# Patient Record
Sex: Female | Born: 1970 | Race: White | Hispanic: No | Marital: Married | Smoking: Never smoker
Health system: Southern US, Community
[De-identification: ages and names within clinical notes are randomized; demographics above are authoritative.]

## PROBLEM LIST (undated history)

## (undated) DIAGNOSIS — E039 Hypothyroidism, unspecified: Secondary | ICD-10-CM

## (undated) DIAGNOSIS — R413 Other amnesia: Secondary | ICD-10-CM

## (undated) DIAGNOSIS — N809 Endometriosis, unspecified: Secondary | ICD-10-CM

## (undated) DIAGNOSIS — A609 Anogenital herpesviral infection, unspecified: Secondary | ICD-10-CM

## (undated) DIAGNOSIS — I959 Hypotension, unspecified: Secondary | ICD-10-CM

## (undated) DIAGNOSIS — IMO0002 Reserved for concepts with insufficient information to code with codable children: Secondary | ICD-10-CM

## (undated) DIAGNOSIS — C4491 Basal cell carcinoma of skin, unspecified: Secondary | ICD-10-CM

## (undated) DIAGNOSIS — R569 Unspecified convulsions: Secondary | ICD-10-CM

## (undated) DIAGNOSIS — R51 Headache: Secondary | ICD-10-CM

## (undated) DIAGNOSIS — S0990XA Unspecified injury of head, initial encounter: Secondary | ICD-10-CM

## (undated) DIAGNOSIS — M1990 Unspecified osteoarthritis, unspecified site: Secondary | ICD-10-CM

## (undated) HISTORY — DX: Endometriosis, unspecified: N80.9

## (undated) HISTORY — DX: Unspecified injury of head, initial encounter: S09.90XA

## (undated) HISTORY — DX: Reserved for concepts with insufficient information to code with codable children: IMO0002

## (undated) HISTORY — DX: Unspecified convulsions: R56.9

## (undated) HISTORY — DX: Hypothyroidism, unspecified: E03.9

---
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
## 1993-11-30 DIAGNOSIS — S0990XA Unspecified injury of head, initial encounter: Secondary | ICD-10-CM

## 1993-11-30 HISTORY — DX: Unspecified injury of head, initial encounter: S09.90XA

## 1998-06-06 ENCOUNTER — Other Ambulatory Visit: Admission: RE | Admit: 1998-06-06 | Discharge: 1998-06-06 | Payer: Self-pay | Admitting: Obstetrics and Gynecology

## 2000-12-22 ENCOUNTER — Other Ambulatory Visit: Admission: RE | Admit: 2000-12-22 | Discharge: 2000-12-22 | Payer: Self-pay | Admitting: Gynecology

## 2002-10-12 ENCOUNTER — Other Ambulatory Visit: Admission: RE | Admit: 2002-10-12 | Discharge: 2002-10-12 | Payer: Self-pay | Admitting: Internal Medicine

## 2002-11-10 ENCOUNTER — Encounter: Payer: Self-pay | Admitting: Emergency Medicine

## 2002-11-10 ENCOUNTER — Inpatient Hospital Stay (HOSPITAL_COMMUNITY): Admission: EM | Admit: 2002-11-10 | Discharge: 2002-11-13 | Payer: Self-pay | Admitting: Emergency Medicine

## 2002-11-11 ENCOUNTER — Encounter: Payer: Self-pay | Admitting: Pulmonary Disease

## 2003-01-27 ENCOUNTER — Encounter: Payer: Self-pay | Admitting: Emergency Medicine

## 2003-01-27 ENCOUNTER — Inpatient Hospital Stay (HOSPITAL_COMMUNITY): Admission: EM | Admit: 2003-01-27 | Discharge: 2003-01-30 | Payer: Self-pay | Admitting: Emergency Medicine

## 2003-01-28 ENCOUNTER — Encounter: Payer: Self-pay | Admitting: Pulmonary Disease

## 2003-01-29 ENCOUNTER — Encounter: Payer: Self-pay | Admitting: Pediatrics

## 2003-04-13 ENCOUNTER — Encounter: Payer: Self-pay | Admitting: Emergency Medicine

## 2003-04-13 ENCOUNTER — Emergency Department (HOSPITAL_COMMUNITY): Admission: EM | Admit: 2003-04-13 | Discharge: 2003-04-13 | Payer: Self-pay | Admitting: Emergency Medicine

## 2005-06-12 ENCOUNTER — Ambulatory Visit (HOSPITAL_COMMUNITY): Admission: RE | Admit: 2005-06-12 | Discharge: 2005-06-12 | Payer: Self-pay | Admitting: Neurology

## 2006-04-01 ENCOUNTER — Other Ambulatory Visit: Admission: RE | Admit: 2006-04-01 | Discharge: 2006-04-01 | Payer: Self-pay | Admitting: Internal Medicine

## 2006-11-30 DIAGNOSIS — N809 Endometriosis, unspecified: Secondary | ICD-10-CM

## 2006-11-30 HISTORY — PX: PELVIC LAPAROSCOPY: SHX162

## 2006-11-30 HISTORY — DX: Endometriosis, unspecified: N80.9

## 2006-11-30 HISTORY — PX: HYSTEROSCOPY: SHX211

## 2006-12-14 ENCOUNTER — Emergency Department (HOSPITAL_COMMUNITY): Admission: EM | Admit: 2006-12-14 | Discharge: 2006-12-14 | Payer: Self-pay | Admitting: Emergency Medicine

## 2007-05-12 ENCOUNTER — Other Ambulatory Visit: Admission: RE | Admit: 2007-05-12 | Discharge: 2007-05-12 | Payer: Self-pay | Admitting: Gynecology

## 2007-06-06 ENCOUNTER — Encounter: Payer: Self-pay | Admitting: Gynecology

## 2007-06-06 ENCOUNTER — Ambulatory Visit (HOSPITAL_BASED_OUTPATIENT_CLINIC_OR_DEPARTMENT_OTHER): Admission: RE | Admit: 2007-06-06 | Discharge: 2007-06-06 | Payer: Self-pay | Admitting: Gynecology

## 2008-01-01 HISTORY — PX: OOPHORECTOMY: SHX86

## 2008-03-27 ENCOUNTER — Encounter: Admission: RE | Admit: 2008-03-27 | Discharge: 2008-04-20 | Payer: Self-pay | Admitting: *Deleted

## 2008-05-02 ENCOUNTER — Encounter: Admission: RE | Admit: 2008-05-02 | Discharge: 2008-05-02 | Payer: Self-pay | Admitting: Orthopedic Surgery

## 2008-09-10 ENCOUNTER — Emergency Department (HOSPITAL_COMMUNITY): Admission: EM | Admit: 2008-09-10 | Discharge: 2008-09-10 | Payer: Self-pay | Admitting: Emergency Medicine

## 2008-11-07 ENCOUNTER — Emergency Department (HOSPITAL_COMMUNITY): Admission: EM | Admit: 2008-11-07 | Discharge: 2008-11-08 | Payer: Self-pay | Admitting: Emergency Medicine

## 2008-11-12 ENCOUNTER — Other Ambulatory Visit: Admission: RE | Admit: 2008-11-12 | Discharge: 2008-11-12 | Payer: Self-pay | Admitting: Gynecology

## 2008-11-12 ENCOUNTER — Ambulatory Visit: Payer: Self-pay | Admitting: Gynecology

## 2008-11-12 ENCOUNTER — Encounter: Payer: Self-pay | Admitting: Gynecology

## 2008-11-14 ENCOUNTER — Ambulatory Visit: Payer: Self-pay | Admitting: Gynecology

## 2008-12-27 ENCOUNTER — Ambulatory Visit: Payer: Self-pay | Admitting: Gynecology

## 2009-02-02 IMAGING — CT CT HEAD W/O CM
1 of 2 series · 13 of 30 positions shown, 17 images · non-contrast
Comparison: 06/12/2005

CLINICAL DATA: Seizure

CT HEAD WITHOUT CONTRAST
TECHNIQUE: Contiguous axial images were obtained from the base of
the skull through the vertex without contrast.

[Series 2: brain · axial · 0.49mm/px · z∈[+116,+247]mm · 13 of 32 slices shown, 17 images]
[im 3/32  brain]
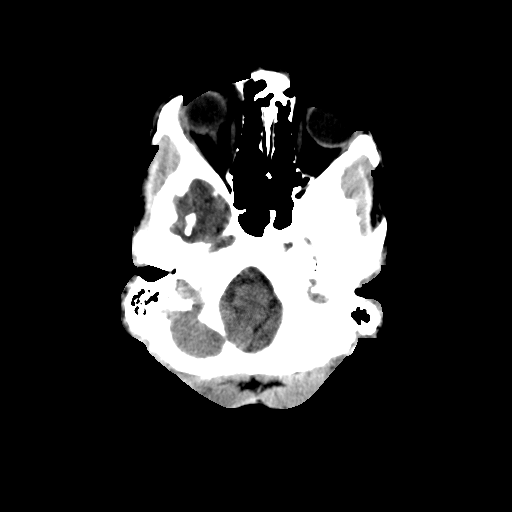
[im 3/32  bone]
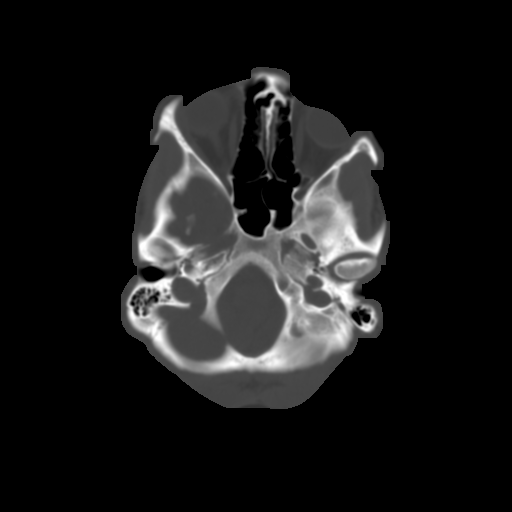
[im 5/32  brain]
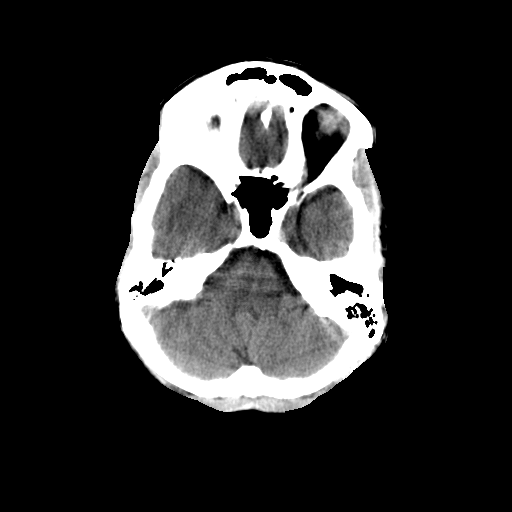
[im 7/32  brain]
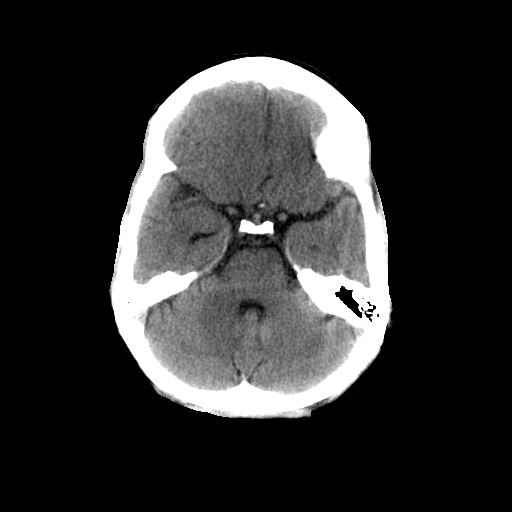
[im 9/32  brain]
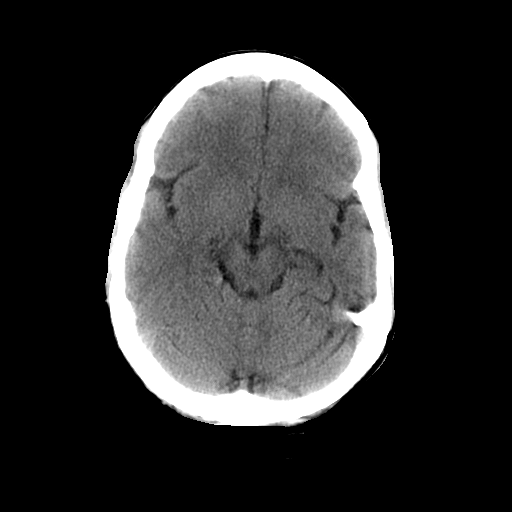
[im 12/32  brain]
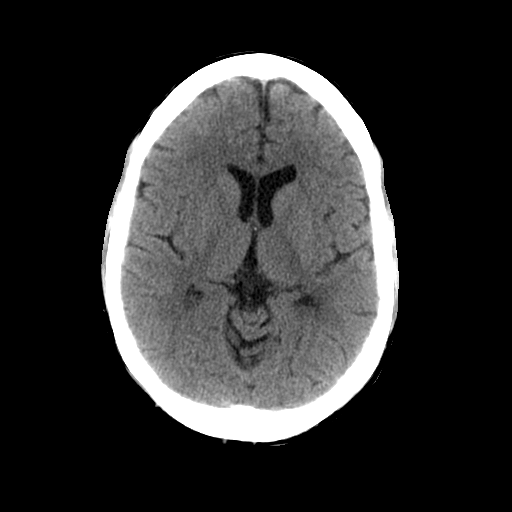
[im 12/32  bone]
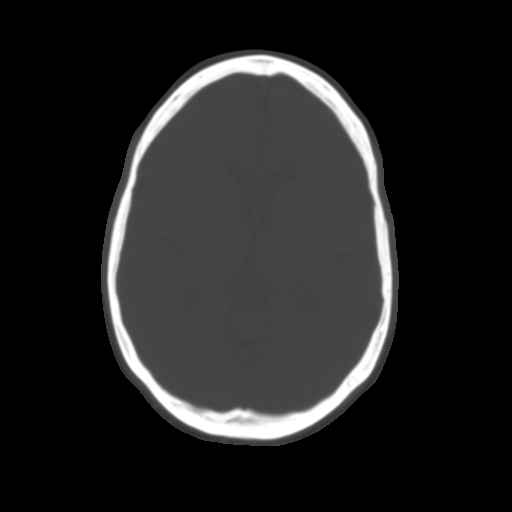
[im 14/32  brain]
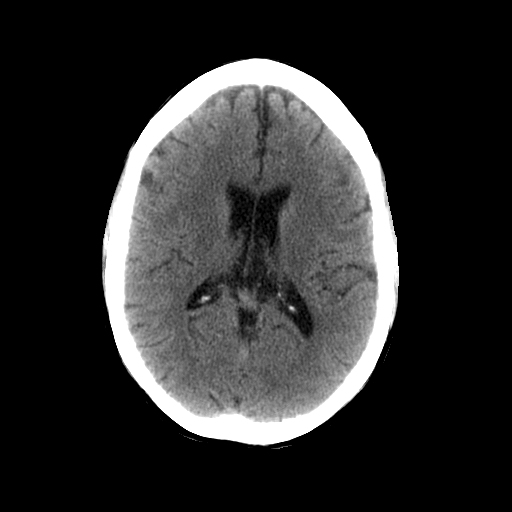
[im 16/32  brain]
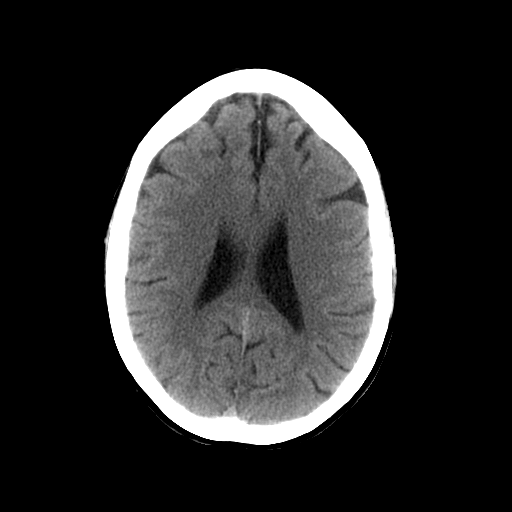
[im 18/32  brain]
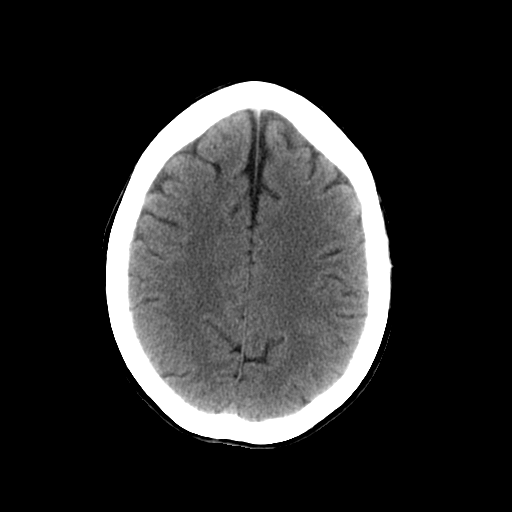
[im 20/32  brain]
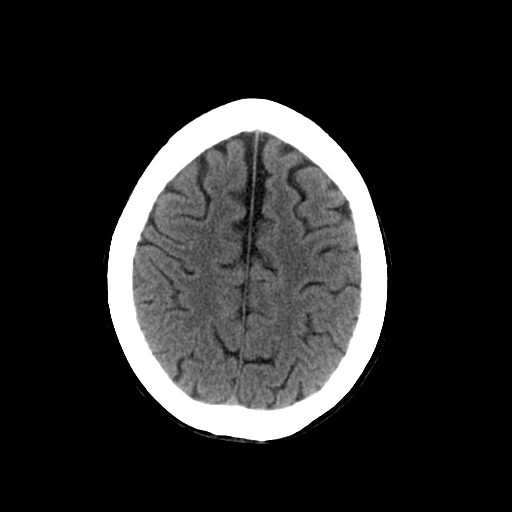
[im 20/32  bone]
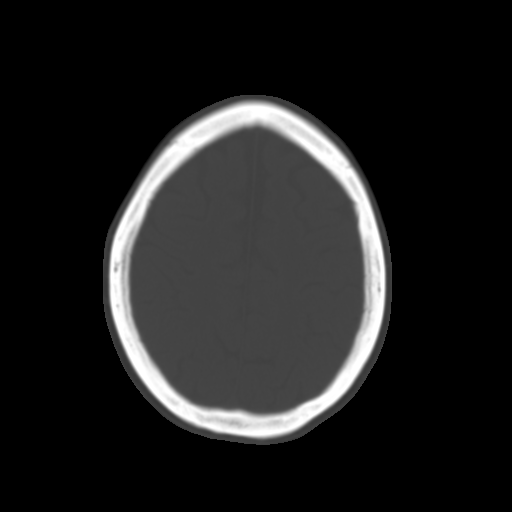
[im 23/32  brain]
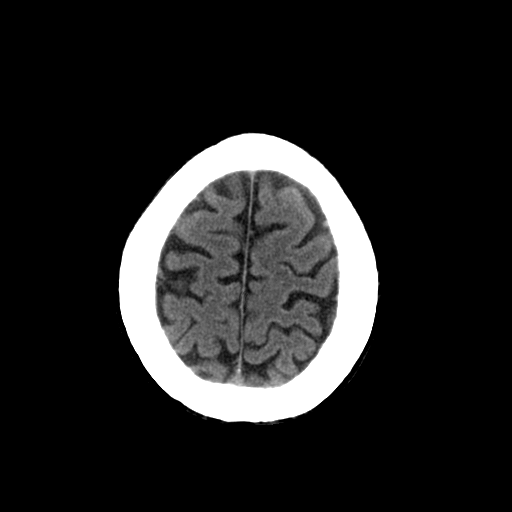
[im 25/32  brain]
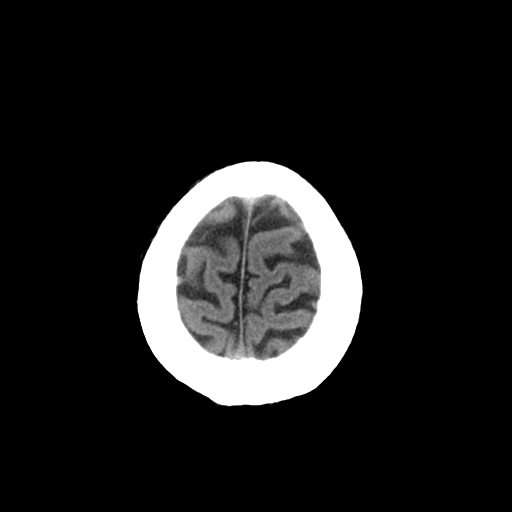
[im 27/32  brain]
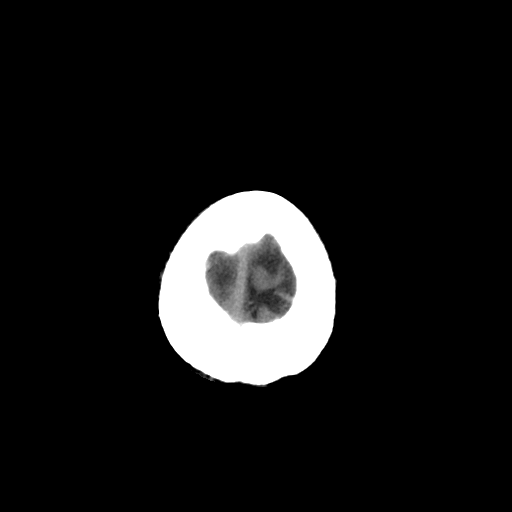
[im 29/32  brain]
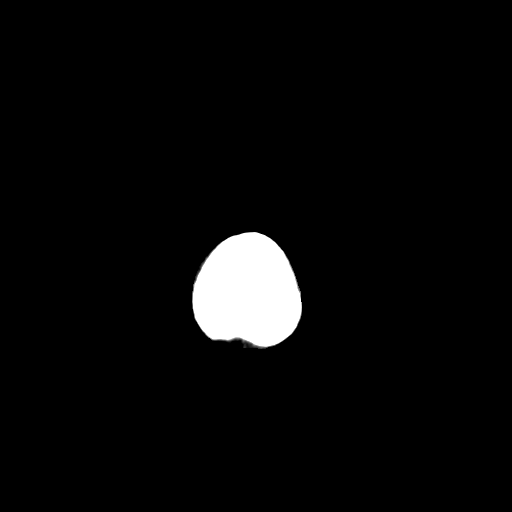
[im 29/32  bone]
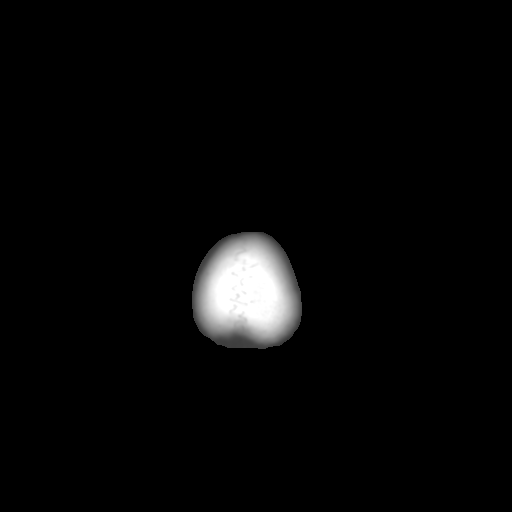

[13 of 30 positions shown; findings below may reference images not displayed]

FINDINGS: No mass effect, midline shift, or acute intracranial
hemorrhage.  Mastoid air cells are clear.  Cranium is intact.
IMPRESSION: No acute intracranial pathology.

## 2009-02-20 ENCOUNTER — Encounter: Payer: Self-pay | Admitting: Emergency Medicine

## 2009-02-21 ENCOUNTER — Inpatient Hospital Stay (HOSPITAL_COMMUNITY): Admission: EM | Admit: 2009-02-21 | Discharge: 2009-02-21 | Payer: Self-pay | Admitting: Neurology

## 2009-11-05 ENCOUNTER — Other Ambulatory Visit: Admission: RE | Admit: 2009-11-05 | Discharge: 2009-11-05 | Payer: Self-pay | Admitting: Gynecology

## 2009-11-05 ENCOUNTER — Ambulatory Visit: Payer: Self-pay | Admitting: Gynecology

## 2010-08-05 ENCOUNTER — Ambulatory Visit: Payer: Self-pay | Admitting: Gynecology

## 2010-08-08 ENCOUNTER — Ambulatory Visit: Payer: Self-pay | Admitting: Gynecology

## 2010-10-30 DIAGNOSIS — IMO0002 Reserved for concepts with insufficient information to code with codable children: Secondary | ICD-10-CM | POA: Insufficient documentation

## 2010-10-30 HISTORY — DX: Reserved for concepts with insufficient information to code with codable children: IMO0002

## 2010-11-06 ENCOUNTER — Ambulatory Visit: Payer: Self-pay | Admitting: Gynecology

## 2010-11-12 ENCOUNTER — Other Ambulatory Visit
Admission: RE | Admit: 2010-11-12 | Discharge: 2010-11-12 | Payer: Self-pay | Source: Home / Self Care | Admitting: Gynecology

## 2010-12-08 ENCOUNTER — Ambulatory Visit
Admission: RE | Admit: 2010-12-08 | Discharge: 2010-12-08 | Payer: Self-pay | Source: Home / Self Care | Attending: Gynecology | Admitting: Gynecology

## 2010-12-21 ENCOUNTER — Encounter: Payer: Self-pay | Admitting: Obstetrics and Gynecology

## 2011-03-12 LAB — URINE MICROSCOPIC-ADD ON

## 2011-03-12 LAB — URINALYSIS, ROUTINE W REFLEX MICROSCOPIC
Bilirubin Urine: NEGATIVE
Glucose, UA: NEGATIVE mg/dL
Ketones, ur: NEGATIVE mg/dL
Leukocytes, UA: NEGATIVE
Nitrite: NEGATIVE
Protein, ur: 30 mg/dL — AB
Specific Gravity, Urine: 1.016 (ref 1.005–1.030)
Urobilinogen, UA: 0.2 mg/dL (ref 0.0–1.0)
pH: 5.5 (ref 5.0–8.0)

## 2011-03-12 LAB — CBC
HCT: 39.5 % (ref 36.0–46.0)
HCT: 39.5 % (ref 36.0–46.0)
Hemoglobin: 13.5 g/dL (ref 12.0–15.0)
Hemoglobin: 13.2 g/dL (ref 12.0–15.0)
MCHC: 34.1 g/dL (ref 30.0–36.0)
MCHC: 33.4 g/dL (ref 30.0–36.0)
MCV: 92.7 fL (ref 78.0–100.0)
MCV: 92.8 fL (ref 78.0–100.0)
Platelets: 206 10*3/uL (ref 150–400)
Platelets: 203 10*3/uL (ref 150–400)
RBC: 4.26 MIL/uL (ref 3.87–5.11)
RBC: 4.26 MIL/uL (ref 3.87–5.11)
RDW: 12.6 % (ref 11.5–15.5)
RDW: 12.3 % (ref 11.5–15.5)
WBC: 8.2 10*3/uL (ref 4.0–10.5)
WBC: 13.3 10*3/uL — ABNORMAL HIGH (ref 4.0–10.5)

## 2011-03-12 LAB — BASIC METABOLIC PANEL
BUN: 3 mg/dL — ABNORMAL LOW (ref 6–23)
CO2: 24 mEq/L (ref 19–32)
Calcium: 8.3 mg/dL — ABNORMAL LOW (ref 8.4–10.5)
Chloride: 107 mEq/L (ref 96–112)
Creatinine, Ser: 0.57 mg/dL (ref 0.4–1.2)
GFR calc Af Amer: >60 mL/min (ref >60)
GFR calc non Af Amer: >60 mL/min (ref >60)
Glucose, Bld: 101 mg/dL — ABNORMAL HIGH (ref 70–99)
Potassium: 3.3 mEq/L — ABNORMAL LOW (ref 3.5–5.1)
Sodium: 135 mEq/L (ref 135–145)

## 2011-03-12 LAB — COMPREHENSIVE METABOLIC PANEL
ALT: 16 U/L (ref 0–35)
AST: 24 U/L (ref 0–37)
Albumin: 4 g/dL (ref 3.5–5.2)
Alkaline Phosphatase: 35 U/L — ABNORMAL LOW (ref 39–117)
BUN: 5 mg/dL — ABNORMAL LOW (ref 6–23)
CO2: 20 mEq/L (ref 19–32)
Calcium: 8 mg/dL — ABNORMAL LOW (ref 8.4–10.5)
Chloride: 92 mEq/L — ABNORMAL LOW (ref 96–112)
Creatinine, Ser: 0.76 mg/dL (ref 0.4–1.2)
GFR calc Af Amer: >60 mL/min (ref >60)
GFR calc non Af Amer: >60 mL/min (ref >60)
Glucose, Bld: 106 mg/dL — ABNORMAL HIGH (ref 70–99)
Potassium: 3.2 mEq/L — ABNORMAL LOW (ref 3.5–5.1)
Sodium: 122 mEq/L — ABNORMAL LOW (ref 135–145)
Total Bilirubin: 0.6 mg/dL (ref 0.3–1.2)
Total Protein: 5.8 g/dL — ABNORMAL LOW (ref 6.0–8.3)

## 2011-03-12 LAB — DIFFERENTIAL
Basophils Absolute: 0 10*3/uL (ref 0.0–0.1)
Basophils Relative: 0 % (ref 0–1)
Eosinophils Absolute: 0 10*3/uL (ref 0.0–0.7)
Eosinophils Relative: 0 % (ref 0–5)
Lymphocytes Relative: 5 % — ABNORMAL LOW (ref 12–46)
Lymphs Abs: 0.7 10*3/uL (ref 0.7–4.0)
Monocytes Absolute: 0.5 10*3/uL (ref 0.1–1.0)
Monocytes Relative: 4 % (ref 3–12)
Neutro Abs: 12 10*3/uL — ABNORMAL HIGH (ref 1.7–7.7)
Neutrophils Relative %: 90 % — ABNORMAL HIGH (ref 43–77)

## 2011-03-12 LAB — RAPID URINE DRUG SCREEN, HOSP PERFORMED
Amphetamines: NOT DETECTED
Barbiturates: NOT DETECTED
Benzodiazepines: POSITIVE — AB
Cocaine: NOT DETECTED
Opiates: NOT DETECTED
Tetrahydrocannabinol: NOT DETECTED

## 2011-03-12 LAB — TSH: TSH: 0.175 u[IU]/mL — ABNORMAL LOW (ref 0.350–4.500)

## 2011-03-12 LAB — POCT PREGNANCY, URINE: Preg Test, Ur: NEGATIVE

## 2011-04-14 NOTE — Consult Note (Signed)
NAME:  [name] NO.:  [id number]   MEDICAL RECORD NO.:  [id number]          PATIENT TYPE:  EMS   LOCATION:  ED                           FACILITY:  [facility]   PHYSICIAN:  Marlan Palau, M.D.  DATE OF BIRTH:  1971-03-05   DATE OF CONSULTATION:  DATE OF DISCHARGE:                                 CONSULTATION   HISTORY:  [name] is a 40 year old right-handed white female born  27-Oct-1971 with a history of a closed head injury after a fall in  [place] and subsequent subdural hematoma.  The patient had a prolonged  recovery phase lasting a couple months but has had subsequent seizure  events.  Seizures occasionally have been associated with a prolonged  postictal period and a left hemiparesis.  The patient's seizures may be  partial complex in nature.  The patient, however, comes in today with 2  generalized seizures and a prolonged postictal phase.  CT scan of the  brain has been done and shows no acute changes.  The patient has been  noted to have a sodium level of 122 and has been on Trileptal 750 mg  twice daily.  Also on Keppra 1000 mg daily and acetazolamide 250 mg at  night.  The patient is being seen at this point for further evaluation  of the seizure events.   PAST MEDICAL HISTORY:  1. History of closed head injury, subdural hematoma in the past.  2. Seizures secondary to number 1.  3. History of migraine.  4. Hypothyroidism.  5. Hyponatremia on this admission.   MEDICATIONS:  1. Trileptal 750 mg twice daily.  2. Synthroid 0.125 mg daily.  3. Multivitamins.  4. Diamox 250 mg daily.  5. Keppra 1000 mg daily.   ALLERGIES:  No known drug allergies.   SOCIAL HISTORY:  Does not smoke or drink.  This patient is married,  lives in the [place] area.  Does not work, has no children.   FAMILY MEDICAL HISTORY:  Notable that mother and father are living.  The  patient has 4 stepbrothers.  No significant past family medical history  noted.  No  family history of seizures is noted.   REVIEW OF SYSTEMS:  At this time cannot be obtained.  The patient is  sleepy, drowsy, will mumble when stimulated but otherwise does not  answer questions.   PHYSICAL EXAMINATION:  VITALS:  Blood pressure is 151/73, heart rate  117, respiratory rate 20, temperature afebrile.  GENERAL:  The patient is a fairly well-developed white female who is  sleepy, lethargic at the time examination.  HEENT:  Head is atraumatic.  Eyes, pupils are round, react to light.  Discs are flat.  Positive doll's eyes noted.  The patient will blink to  threat bilaterally.  NEUROLOGIC:  The patient will move all 4 extremities when stimulated and  verbalize some, telling the examiner to stop when the patient is  stimulated with pinprick sensation on the extremities.  Deep tendon  reflexes remain symmetrically normal.  Toes are equivocal bilaterally.  The patient does not cooperate for cerebellar testing,  could not be  ambulated.   LABORATORY:  Notable for a white count of 13.3, hemoglobin 13.2,  hematocrit 39.5, MCV of 92.8, platelets of 203.  Urinalysis reveals  specific gravity of 1.016, pH of 5.5, 30 mg dL protein, 0-2 red cells,  few bacteria.  Sodium 122, potassium 3.2, chloride of 92, CO2 of 20,  glucose of 106, BUN of 5, creatinine of 0.76, calcium 8.0, total protein  5.8, albumin 4.0.  Urine drug screen is positive for benzodiazepines but  otherwise negative.  CT scan of the head is as above.   IMPRESSION:  1. History of recurring seizures today with prolonged postictal phase.  2. Hyponatremia.  3. Closed head injury in the past.   PLAN:  This patient likely has had significant seizures associated with  hyponatremia.  The patient is on Trileptal which may be a cause of this.  The patient may need to come off this medication and continue with  Keppra, possibly increasing dose.  The patient will remain on Diamox for  now.  Need to follow sodium levels.  Check  thyroid profile and check an  EEG study to rule out subclinical status epilepticus.  Will follow the  patient's clinical course while in-house.      Marlan Palau, M.D.  Electronically Signed     CKW/MEDQ  D:  02/20/2009  T:  02/21/2009  Job:  [id number]   cc:   Thora Lance, M.D.  Fax: [phone]   [facility]  [address] [place]  [place], [place]

## 2011-04-14 NOTE — Op Note (Signed)
[name], [name]                ACCOUNT NO.:  [id number]   MEDICAL RECORD NO.:  [id number]          PATIENT TYPE:  AMB   LOCATION:  [facility]                         FACILITY:  [facility]   PHYSICIAN:  Timothy P. Fontaine, M.D.DATE OF BIRTH:  03/30/1971   DATE OF PROCEDURE:  06/06/2007  DATE OF DISCHARGE:                               OPERATIVE REPORT   PREOPERATIVE DIAGNOSES:  Irregular vaginal bleeding, pelvic pain,  dysmenorrhea.   POSTOPERATIVE DIAGNOSES:  1. Rule out endometrial polyps.  2. Endometriosis.   PROCEDURE:  Hysteroscopy, D&C, laparoscopic biopsy and fulguration of  endometriosis.   SURGEON:  Dr. Audie Box.   ANESTHETIC:  1. General.  2. 1% lidocaine paracervical block approximately 10 mL total.  3. 25% Marcaine incisional injection of approximately 8 mL total.   SPECIMEN:  1. Endometrial curetting.  2. Perineal biopsy.   COMPLICATIONS:  None.   ESTIMATED BLOOD LOSS:  Minimal.   SORBITOL DISCREPANCY:  Minimal.   FINDINGS:  1. EUA; external BUS vagina normal.  Cervix normal.  2. Bimanual; uterus normal size, midline and mobile.  Adnexa without      masses.  3. Hysteroscopic; polypoid tufts of tissue lower uterine segment,      questionable polyps versus endometrial tufting. Hysteroscopy      otherwise is adequate normal, noting fundus, anterior-posterior      uterine surfaces, lower uterine segment, endocervical canal, right      and left tubal ostia all visualized.  At the end of the [facility] no      remaining tufted areas were present.  4. Laparoscopic; anterior cul-de-sac with three separate areas, two of      classic powder burn endometriosis, one to the right and one to the      left of midline in the vesicouterine fold. The second area was      white patchy endometriosis to the upper left vesicouterine fold.      Representative biopsy of the powder burn endometriosis was taken to      the right.  Posterior cul-de-sac with area of puckered white  scarring left uterosacral ligament with surrounding shaggy red type      endometriosis. A similar puckered white area on the right      uterosacral ligament with a surrounding classic powder burn lesion.      Uterus grossly normal size, shape and contour.  Right and left      fallopian tubes normal length, caliber, fimbriated ends.  Right      ovary grossly normal, free and mobile. Left ovary with small area      questionable powder burn lesion versus hemorrhagic change from      ovulation. Area was fulgurated as was all other visible areas of      endometriosis.  At the end of the procedure Intercede was placed      over all treated areas. Upper abdominal examination; appendix not      visualized, not clearly apparent in the cecal region.  Liver      smooth, no abnormalities.  Gallbladder grossly normal.  No upper  abdominal adhesive disease.   PROCEDURE:  The patient was taken to the operating room, underwent  general anesthesia and was placed low dorsal lithotomy position,  received an abdominal, perineal, vaginal preparation with Betadine  solution.  Bladder emptied with indwelling Foley catheterization placed  in sterile technique.  An EUA was performed.  The patient was draped in  usual fashion.  Cervix visualized with a speculum, anterior lip grasped  with a single-tooth tenaculum and a paracervical block using 1%  lidocaine was placed a total of 10 mL.  The cervix was then gently  gradually dilated to admit the operative hysteroscope and hysteroscopy  was performed with findings noted above.  Sharp curettage was then  performed.  Specimen sent to pathology.  Rehysteroscopy showed a normal-  appearing empty cavity, good distension, no evidence of perforation.  A  Hulka tenaculum was then placed in the cervix.  The surgeon and  assistant regloved and attention was then turned to the laparoscopic  portion of the procedure.  A vertical infraumbilical incision was made  and using  the OptiVu direct entry trocar, the 10-mm port was placed  without difficulty and the abdomen was subsequently insufflated.  Right  and left suprapubic 5-mm ports were then placed under direct  visualization after transillumination for the vessels without  difficulty.  Examination of pelvic organs, upper abdominal exam was  carried out with findings noted above.  Representative area in the  vesicouterine fold was identified with a classic powder burn  endometriotic implant.  This was tented and elevated and a biopsy was  taken and sent to pathology.  Subsequently all identified areas of  endometriosis were bipolar cauterized in a brushing technique.  Intercede was then placed over all treated areas.  The gas was allowed  to escape. The suprapubic ports removed, adequate hemostasis was  visualized at all treated areas under low pressure situation as well as  the 5-mm port sites.  The infraumbilical port was then backed out under  direct visualization showing adequate hemostasis and no evidence of  hernia formation.  A 0 Vicryl interrupted subcutaneous fascial stitch  was placed infraumbilically. All skin incisions were injected using  0.25% of Marcaine, approximately 8 mL total and all skin incisions  closed using 4-0 Vicryl in an interrupted cuticular stitch.  Hulka  tenaculum was removed as was the indwelling Foley which showed clear  abundant urine and the patient was placed in supine position, awakened  without difficulty, and taken to recovery room in good condition having  tolerated procedure well.      Timothy P. Fontaine, M.D.  Electronically Signed     TPF/MEDQ  D:  06/06/2007  T:  06/06/2007  Job:  [id number]

## 2011-04-14 NOTE — Procedures (Signed)
EEG NUMBER:  [id number].   CLINICAL HISTORY:  This 40 year old patient being evaluated for  seizures.   MEDICATION LISTED:  Keppra, Trileptal, Synthroid, Tylenol, Ativan,  Diamox, and potassium.   This is a portable 17-channel EEG recorded with the patient awake and  asleep using standard 10/20 electrode placement.   Background awake rhythm consists of 8-9 Hz alpha, which is of diminished  amplitude, synchronous, reactive to eye opening and closure.  No  paroxysmal epileptiform activity, spike, or sharp waves are noted.  Changes of drowsiness and light sleep were achieved and show normal  physiological findings.  Length of the recording is 20.5 minutes.  Technical component is average.  EKG tracing reveals regular sinus  rhythm.  Hyperventilation not performed and photic stimulation was not  done this being a portable study.   IMPRESSION:  This EEG performed during awake and light sleep states is  within normal limits.  No definitive epileptiform features were noted.           ______________________________  Sunny Schlein. Pearlean Brownie, MD     [id number]  D:  02/21/2009 17:43:15  T:  02/22/2009 10:07:42  Job #:  [id number]   cc:   Marlan Palau, M.D.  Fax: [phone]

## 2011-04-14 NOTE — H&P (Signed)
NAMEELISSA, [name]                ACCOUNT NO.:  [id number]   MEDICAL RECORD NO.:  [id number]          PATIENT TYPE:  AMB   LOCATION:  [facility]                         FACILITY:  [facility]   PHYSICIAN:  Timothy P. Fontaine, M.D.DATE OF BIRTH:  1971-06-10   DATE OF ADMISSION:  06/06/2007  DATE OF DISCHARGE:                              HISTORY & PHYSICAL   CHIEF COMPLAINT:  Irregular bleeding, pelvic pain.   HISTORY OF PRESENT ILLNESS:  40 year old G0, who presents with a 4-5-  month history of irregular bleeding and increasing pelvic pain primarily  left-sided, sharp pinching to sharp stabbing pain almost on a daily  basis with cramping.  She is noticing increasing dysmenorrhea, no  dyspareunia.  She does have a positive family history of endometriosis  in her mother that ultimately required hysterectomy.  Her outpatient  evaluation included a sonohysterogram which shows showed a questionable  lower uterine segment endocervical polyp.  The cavity itself was clear  and the ovaries and the remainder of the ultrasound appeared to be  normal.  She had an endometrial biopsy which showed secretory  endometrium.  She had previously been on oral contraceptives but  discontinued this due to seizure activity.  she is admitted at this time  for laparoscopy for her pelvic pain, rule out endometriosis,  hysteroscopy for her irregular bleeding and questionable lower uterine  segment endocervical polyps.  The significant for her history includes a  closed head injury in 1995 for which she subsequently had seizure  activity.  She now is controlled on medication per her list of  medications, although she still has sensory type seizure-like activity  which leads to a petite mal type response where she is unresponsive to  questioning and is unaware of her surroundings at that time.  She had a  normal TSH and in consultation with her neurologist, Melvyn Novas,  M.D., she feels that she is improved for  surgery to include general  anesthesia and that this was approved by her.   PAST MEDICAL HISTORY:  Closed head injury 1995 subsequent seizure-like  activity, and hypothyroidism.   PAST SURGICAL HISTORY:  Tracheostomy with her accident.   ALLERGIES:  No known drug allergies.   CURRENT MEDICATIONS:  See her and medical reconciliation sheet but  medicines include:  1. Trileptal 700 mg in the a.m.  2. Phenytoin 200 mg in the a.m. at noon.  3. Synthroid 0.125 in the afternoon.  4. Trileptal 750 mg.  5. Phenytoin 100 mg.  6. Dilantin 50 mg  7. Acetazolamide 250 mg one-half p.o.   REVIEW OF SYSTEMS:  Noncontributory.   FAMILY HISTORY:  Noncontributory.   SOCIAL HISTORY:  Noncontributory.   PHYSICAL EXAM:  VITAL SIGNS:  Afebrile, vital signs stable.  HEENT: Normal.  LUNGS:  Clear.  CARDIAC: Regular rate.  No murmurs, rubs, or gallops.  ABDOMEN:  Benign.  PELVIC:  External BUS, vagina normal.  Cervix normal.  Uterus normal  size, midline, mobile, nontender.  Adnexa without masses or tenderness.   ASSESSMENT:  40 year old G0, increasing dysmenorrhea, left-sided pelvic  pain on daily basis, irregular bleeding,  sonohysterogram suggestive of  lower uterine segment endocervical polyps, ultrasound otherwise  negative, endometrial biopsy is negative for diagnostic laparoscopy,  hysteroscopy, D&C.  I reviewed with the patient the proposed surgery,  expected intraoperative and postoperative courses and the patient was  provided the ACOG booklets both on laparoscopy and hysteroscopy  preoperatively.  The patient understands there are no guarantees as far  as bleeding relief or pain relief but her pain and bleeding may continue  to worsen or change following the procedure.  The patient understands a  working diagnosis is endometriosis for her pelvic pain and endometrial  or cervical polyps for the dysfunctional bleeding pattern.  She  understands that we may not find these at the time of  surgery.  She may  have normal findings or that other pathology may present itself.  She  also understands that we will try to eradicate whatever pathology is  encountered at that time but we may not be able to remove all of the  pathology and understands and accepts this.  The proposed intraoperative  postoperative courses were reviewed to include instrumentation, use of  the hysteroscope, D&C, resectoscope, insufflation, trocar placement, use  of sharp blunt dissection, electrocautery and laser were all reviewed  with her.  The risks of the procedure both immediately recognized and  delay recognized were reviewed to include hysteroscopically uterine  perforation and damage to internal organs, laparoscopically damage to  internal organs including bowel, bladder, ureters, vessels and nerves  necessitating major exploratory reparative surgeries and future  reparative surgeries including bowel resection, ostomy formation was all  discussed, understood and accepted.  The risks of hemorrhage  necessitating transfusion and risks of transfusion were reviewed to  include transfusion reaction, hepatitis, HIV, Mad Cow disease and other  unknown entities.  The risks of infection requiring prolonged  antibiotics as well as the risks of incisional complications requiring  opening and draining of incisions, subsequent hernia formation was all  discussed, understood and accepted.  Again the patient understands there  are no guarantees as far as pain relief or bleeding relief and she  understands, accepts this.  Lastly the case was discussed with Dr.  Vickey Huger her neurologist and she is comfortable with proceeding with the  surgery and general anesthesia.      Timothy P. Fontaine, M.D.  Electronically Signed     TPF/MEDQ  D:  06/01/2007  T:  06/02/2007  Job:  [id number]

## 2011-04-14 NOTE — Discharge Summary (Signed)
NAMENIHAL, DOAN NO.:  [id number]   MEDICAL RECORD NO.:  [id number]          PATIENT TYPE:  EMS   LOCATION:  ED                           FACILITY:  [facility]   PHYSICIAN:  Marlan Palau, M.D.  DATE OF BIRTH:  August 26, 1971   DATE OF ADMISSION:  02/20/2009  DATE OF DISCHARGE:  02/21/2009                               DISCHARGE SUMMARY   ADMISSION DIAGNOSES:  1. History of seizure disorder with recent recurrence.  2. History of closed head injury.  3. Hyponatremia, possibly secondary to Trileptal.   DISCHARGE DIAGNOSES:  1. History of seizure disorder with recent recurrence.  2. Hyponatremia, likely medication induced.  3. Prolonged postictal phase.  4. History of closed head injury.   PROCEDURES DURING THIS ADMISSION:  1. CT of the head.  2. EEG study.   COMPLICATIONS WITH THE ABOVE PROCEDURES:  None.   HISTORY OF PRESENT ILLNESS:  [name] is a 40 year old right-handed  white female born on 04-Oct-1971, with a history of seizures  following closed head injury.  The patient has been followed by Dr.  Vickey Huger, has been treated with Dilantin, Trileptal, and Keppra.  Dilantin was discontinued recently, and the patient was increased on the  Trileptal in January 2010.  The patient presents at this time with 2  generalized-type seizures that were preceded by a staring episode more  typical of her usual seizure events.  The patient was noted to have a  sodium level of 122 when she came into the hospital.  The patient had a  prolonged postictal phase, was out of bed and lethargic for 12-14 hours.  The patient was admitted for observation and further management.   PAST MEDICAL HISTORY:  1. Closed head injury, subdural hematoma following injury requiring      evacuation.  2. Seizure secondary to #1.  3. Migraine headaches.  4. Hypothyroidism.  5. Hyponatremia on admission.   MEDICATIONS ON ADMISSION:  1. Trileptal 750 mg in the morning, 900 mg in  the evening.  2. Synthroid 0.125 mg daily.  3. Multivitamins daily.  4. Diamox 250 mg at night.  5. Keppra 1000 mg twice daily.   ALLERGIES:  The patient has no known allergies.   Not smoke or drink.   Please refer to history and physical dictation summary for social  history, family history, review of systems, and physical examination.   LABORATORY VALUES:  Notable for initial sodium level of 122.  This is  increased to 135 on the day of admission, potassium is 3.3, chloride  107, CO2 of 24, glucose 101, BUN of 3, creatinine 0.57, calcium 8.3,  total bili 0.6, alkaline phosphatase of 35, SGOT of 24, SGPT of 16,  total protein 5.8, albumin 4.0.  Urine drug screen positive for  benzodiazepines.  Urinalysis reveals specific gravity of 1.016, pH of  5.5, protein 30 mg/dL, 0-2 red cells.  White count initially was 13.3,  gone down to 8.2; hemoglobin of 13.5; hematocrit 39.5; MCV of 92.7;  platelets of 206.   HOSPITAL COURSE:  This patient has done well during  the course of  hospitalization.  The patient was admitted to the emergency room for  evaluation of seizures with prolonged postictal phase.  Patient has  undergone a CT scan of the brain that is relatively unremarkable with no  acute intracranial abnormalities.  The patient was reduced on her  Trileptal dose to 300 mg twice daily and Keppra was increased to 1000 mg  3 times daily after a 1000 mg load.  The patient was given normal saline  IV 75 mL an hour.  Overnight, the patient has start to become more  responsive, is slightly sleepy but he can converse.  He is oriented to  person, place and is not oriented to month or year.  The patient will go  for an EEG today.  We will replace the potassium if the patient  continues to do well.  We will discharge the patient to home in  afternoon.  The patient will be discharged on Keppra 1000 mg 3 times  daily.  The patient will be reduced on Trileptal to 300 mg twice daily.  The patient  will be on Synthroid 0.125 mg daily, multivitamins 1 a day,  Diamox 250 mg at night.  The patient will follow up with [facility] and to see Dr. Vickey Huger over the next 3-6 weeks.  If any further problems arise, the patient will contact our office      C. Lesia Sago, M.D.  Electronically Signed     CKW/MEDQ  D:  02/21/2009  T:  02/21/2009  Job:  [id number]   cc:   [facility]

## 2011-04-17 NOTE — Discharge Summary (Signed)
NAME:  [name], [name]                   ACCOUNT NO.:  [id number]   MEDICAL RECORD NO.:  [id number]                   PATIENT TYPE:  INP   LOCATION:  3027                                 FACILITY:  [facility]   PHYSICIAN:  Deanna Artis. Sharene Skeans, M.D.           DATE OF BIRTH:  11/22/1971   DATE OF ADMISSION:  01/27/2003  DATE OF DISCHARGE:  01/30/2003                                 DISCHARGE SUMMARY   FINAL DIAGNOSES:  1. Status epilepticus.  2. 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  3. Hypokalemia.  4. Respiratory failure requiring intubation and ventilation.   PROCEDURE:  Electroencephalography.   COMPLICATIONS:  None.   SUMMARY OF THE HOSPITALIZATION:  The patient is a 40 year old woman who  suffered a closed head injury in 1995 and has had intractable simple and  complex partial seizures until December 2002, when she had generalized tonic-  clonic seizure causing status epilepticus and requiring intubation.  The  same thing happened on the afternoon of January 27, 2003; see admission  history and physical examination for details.   The patient came into the hospital in status epilepticus and required 10 mg  of Valium, a total of 8 mg of Ativan, 5 mg of Versed and 6 mg of Pavulon,  ultimately to not only bring her seizures under control but to get her  stable on the ventilator.   Her initial laboratories:  Sodium 141, potassium 4.6, chloride 109, CO2 19,  BUN 10; hemoglobin 17, hematocrit 51; pH 6.81 (venous), PCO2 102.6,  bicarbonate 16, delta base -21.  Repeat arterial blood gas about one hour  later:  PCO2 36.5, PO2 448, bicarbonate 22, pH 7.38, delta base -3.  Urinalysis:  Specific gravity 1.020, pH 6.  Dilantin level 8.6.  White count  9800, 45 polys, 48 lymphs, 5 monos and 1 eosinophil, hemoglobin 15.4,  hematocrit 46.3, MCV 93.5, platelets 266,000.  Negative drug screen.  Negative tricyclic  screen.  Negative urine pregnancy test.  Prothrombin time  14.6 (INR 1.1, PTT 24).   Chest x-ray:  ET tube was okay.  There was no sign of acute disease.   The patient was loaded with Dilantin 500 mg.  She was also given Rocephin 2  mg because she came in with fever.   The patient was extubated the first day.  She became extremely combative, as  was predicted, and we were in a weaning protocol and electively extubated  her.  She did well.  She has remained in a confusional state with very  significant problems with memory, more so than her baseline.   LABORATORY STUDIES:  White count 8700, hemoglobin 12.8, hematocrit 36.8,  platelet count 177,000.  Sodium 136, potassium 3.5, chloride 109, CO2 22,  BUN 6, creatinine 0.7, glucose 89.  Her TSH was markedly elevated at 13.334.  As result of this, thyroid functions were checked, T4 7.2, free T4 1.07,  T3  99.6; all of these were normal.  The etiology of her increased TSH is  unclear and we will need to pursue that further after discharge.   The patient had an EEG which showed an otherwise normal study in a drowsy  state.  No seizure activity was seen.   DISCHARGE PHYSICAL EXAMINATION:  VITAL SIGNS:  Temperature 99.4 (she  continued to have a slight low-grade fever with no source), blood pressure  112/64, resting pulse 89, respirations 18.  Pulse oximetry is 96% on room  air.  HEENT/NECK:  No signs of infection.  Supple neck.  Full range of motion.  No  cranial or cervical bruits.  LUNGS:  Lungs clear to auscultation.  HEART:  No murmurs.  Pulses normal.  ABDOMEN:  Abdomen soft and nontender.  Bowel sounds normal.  EXTREMITIES:  Extremities are well-formed without edema, cyanosis,  alterations in tone or tight heel cords.  She says that she is bruised but I  do not see any bruising on her skin.  NEUROLOGIC:  Mental status:  Awake.  She has poor memory.  She names objects  and follows commands.  Cranial nerves:  Round reactive pupils.   Visual  fields full.  Extraocular movements full and conjugate.  Symmetric facial  strength.  Midline tongue and uvula.  Motor examination:  Normal strength,  tone and mass.  Good fine motor movements.  No pronator drift.  Gait and  station were not tested.  Cerebellar:  No tremor or dystaxia or dysmetria.  Deep tendon reflexes are symmetric and normal.  The patient did not have  ankle clonus.  She had bilateral flexor plantar responses.   The patient is well enough to be discharged.   DISCHARGE MEDICATIONS:  She will be discharged on:  1. Trileptal 150 mg tablets -- three and a half twice daily.  2. Dilantin 100 mg tablets -- four once daily.  3. Acetazolamide 250 mg one at bedtime.  4. Oral contraceptive patch -- I do not know its name.   FOLLOWUP:  She will call Dr. Porfirio Mylar Dohmeier for followup in our office in  two to four weeks' time, [phone].                                                Deanna Artis. Sharene Skeans, M.D.    [facility]  D:  01/30/2003  T:  01/30/2003  Job:  [id number]   cc:   Melvyn Novas, M.D.  [address]  [address]  [place]  [place] [address]  Fax: [phone]

## 2011-04-17 NOTE — H&P (Signed)
NAME:  [name], [name]                   ACCOUNT NO.:  [id number]   MEDICAL RECORD NO.:  [id number]                   PATIENT TYPE:  INP   LOCATION:  1824                                 FACILITY:  [facility]   PHYSICIAN:  Deanna Artis. Sharene Skeans, M.D.           DATE OF BIRTH:  07-05-1971   DATE OF ADMISSION:  01/27/2003  DATE OF DISCHARGE:                                HISTORY & PHYSICAL   CHIEF COMPLAINT:  Status epilepticus.   HISTORY OF PRESENT CONDITION:  The patient is a 40 year old right-handed,  married woman well known to me. She had a severe closed head injury in 1995  with subdural hematoma and coma.  [place]  and needed to be evacuated from there.  In the aftermath, after a long  rehabilitation, she was left with simple and complex partial seizures that  were intractable but of rather mild severity, were generalized tonic-clonic  seizures, and no need for hospitalization.  The patient also was left with a  permanent memory deficit.  She had no other true deficits and was able to  carry out her activities of daily living with ability to drive and take care  of all her other needs as she kept things she needed to remember on a note  pad.   The patient did well until 11/10/2002 when she was admitted with generalized  tonic-clonic seizure progressing to status epilepticus requiring intubation  for about 24 hours.  Dilantin was started at that time.   The patient switched care from me to my partner, Dr. Vickey Huger.  Dilantin was  continued along with other medications.   During the past few days, she has had some simple partial seizures that were  basically somatosensory in nature, about one to two per day.  Today, she had  onset of a simple partial somatosensory seizure which evolved into a complex  partial seizure, a left focal motor seizure, and secondary generalization.  This sequence lasted for about 12 minutes.  She then slept for about  a  minute.  Her generalized tonic-clonic seizure recurred, and she was brought  to the [facility] where she had 10 mg of Valium, Ativan on  three different occasions, and still continue to have generalized tonic-  clonic seizures. She was then given Versed 5 mg, Pavulon 1 mg, and  intubated.   When I came to see her, the patient was stiff, clonic, without convulsive  behavior, without nystagmoid movements.  She managed to protrude her tongue  around her oral airway, was biting on her endotracheal tube.  She had been  given 2 mg of Ativan again without affect, and I decided to give her Pavulon  5 mg at that time so that we could properly deal with her airway.  Prior to  this, her pupils were 3 mm, reactive to 2.5.  Her fundi were normal.  She  had full doll's eyes,  no nystagmus, rigid tone in arms and legs without  clonic activity, and protruding tongue.   After Pavulon, she had no movement, and her pupils remained reactive.   Her laboratory studies are as follows.  Sodium 141, potassium 4.6, chloride  109, CO2 19, B UN 10.  Hemoglobin 17, hematocrit 51.   Initial ISTAT showed a pH of 6.81, PCO2 of 102.6, bicarbonate 16; so mixed  respiratory and metabolic acidosis with delta base of -21.  Repeat arterial  blood gas at 2:14 after intubation and ventilation showed a pH of 7.38, PCO2  of 36.5, PO2 48, bicarbonate 22, delta base of -3.  Urinalysis showed  specific gravity of 1.020, pH 6.0.  White count 9800, hemoglobin 15.4,  hematocrit 46.3, MCV 93.5, platelet count 226,000; 45 polys, 48 lymphs, 5  monos, 1 eosinophil.  Drug screen and tricyclic screen negative.  Urine  pregnancy test negative.  PT 14.6 (INR 1.1).  PTT 24.   Chest x-ray showed endotracheal tube in appropriate position with no acute  disease.   Her Dilantin level was 8.6.  She was given 500 mg of Dilantin IV.  She was  also given a bolus of 500 ml of normal saline.  She will be transferred to  one of the  intensive care units. Dr. Danice Goltz is asked to consult on  this patient for ventilator management with the goal of trying to extubate  this patient today if it is feasible to do so.  Otherwise, we will sedate  her with Diprivan over 24 hours, then extubate her once homeostasis of her  system has been realized.   The reason for her recent prolonged generalized tonic-clonic seizure with  acute life-threatening consequences is unclear.  Long-term solutions to this  are going to be to re-assess the medicines she has been on for seizures  before which include Dilantin, Tegretol, Depakote.  I do not think that she  has been on Topamax Lamictal, Felbatol, although will need to be certain  about that.   CURRENT MEDICATIONS:  1. Trileptal.  I believe that she is on 300 mg twice daily.  That would be a     low dose.  2. Dilantin 300 mg at bedtime.  3. Acetazolamide, dose unknown.  4. Contraceptive patch that got started six days ago.   ALLERGIES:  The patient has no known allergies to medicines. She had an  intolerance to DEPAKOTE which caused weight gain of about 60 pounds which  she has since lost and was not efficacious.   REVIEW OF SYSTEMS:  The patient had been physically well until recently.  No  fever or change in appetite.  Her sleep had improved.  She was started on  oral contraceptive pills for birth control.  She had no change in her  neurologic examination.  She had ongoing problems with memory and no other  focal concerns regarding her nervous system.   PAST SURGICAL HISTORY:  None.   SOCIAL HISTORY:  She is married.  No tobacco or alcohol use.                                               Deanna Artis. Sharene Skeans, M.D.    [facility]  D:  01/27/2003  T:  01/27/2003  Job:  [id number]

## 2011-04-17 NOTE — Consult Note (Signed)
NAME:  [name], [name]                   ACCOUNT NO.:  [id number]   MEDICAL RECORD NO.:  [id number]                   PATIENT TYPE:  INP   LOCATION:  3111                                 FACILITY:  [facility]   PHYSICIAN:  Melvyn Novas, M.D.               DATE OF BIRTH:  1971/04/09   DATE OF CONSULTATION:  11/10/2002  DATE OF DISCHARGE:                                   CONSULTATION   HISTORY OF PRESENT ILLNESS:  Ms. Jaymes Revels is a patient of Dr. Chrissie Noa  Hickling's in the outpatient setting at [facility].  She is a 40 year old female who after a head injury developed psychomotor  seizures or complex partial seizures that are associated with five to 15  second duration of lip smacking, head turning, and sometimes other autonomic  symptoms such as a ringing hand movement or a pill rolling hand movement.  The patient has never generalized but today was during a shopping spree  found in a general convulsion on the floor of the department store and  apparently by EMS brought directly to [facility].  For unknown  reasons, the EMS was able to locate Pheobe's mother who was called and  brought her fiance and has been with her.  The family states that Ms. Prudencio Pair  has never had a generalized seizure before, has never been in status  epilepticus.  She has been treated with primidone, or Mysoline, Trileptal,  and her mom believes acetazolamide for migraine prophylaxis.  Also, this is,  at this state, hearsay, and I do not have a record of exact drug names drug  names and drug levels.  The patient's family will call in from home and  relay her current medication regimen to [place].  Apparently, Ms. Prudencio Pair is going  to get married at the end of this month and wanted to prepare herself for a  possible pregnancy by being slowly weaned off the Mysoline.  She just took  the last Mysoline on Monday or Tuesday, three days ago, and the question  arose if this could have  been a withdrawal reaction.  When she came to the  ER the patient to be intubated was found in a metabolic alkalosis, question  acetazolamide induced, and was admitted to the Critical Care Pulmonary  service of Dr. Sung Amabile.   A chest x-ray showed no pulmonary infiltrates, the trachea is in correct  placement, and there is no evidence of cardiomegaly, effusions, etcetera.  Lab results show a creatinine level of 1.2, bilirubin 0.4, alkaline  phosphatase 56, GOT/GPT 33/21, total protein 6.2, albumin 3.6, calcium 8.3.  Phenytoin level was obtained for unknown reasons since the patient is not on  phenytoin which was 2.5 or smaller than.  She tested positive for  barbiturates which is not surprising since she is on Mysoline, a  phenobarbital derivative.  A urinalysis showed cloudy urine with 50 mg/dl of  ketones, few bacteria, few white  blood cells, and 30 mg/dl of protein.  If  the metabolic abnormalities or a possible urinary tract infection could have  led to today's seizure is not known.  She developed fever after her  admission.  She was norm temperature by EMS and is now 101 degrees  Fahrenheit in the ICU.  Latarra is currently maintained on 5 of peak with  respiratory frequency of 16.   PHYSICAL EXAMINATION:  VITAL SIGNS:  Her blood pressure is 110/60, heart  rate is in the 80s.  LUNGS:  Clear to auscultation.  ABDOMEN:  Soft and nontender.  EXTREMITIES:  There is no peripheral edema.  All peripheral pulses are  equally palpable.  NEUROLOGIC:  This is limited due to the patient's state of sedation after  Ativan and Dilantin IV load.  Pupils react, equal to light and accomodation.  The gaze is conjugate but the patient has trouble to focus.  She can open  her eyes spontaneously.  She has a gag reflex when the tube is moved.  She  is on a Versed drip.  All extremities have equal mass and tone and strength.  Measurement is not possible in her current state of sedation, neither is   sensory testing.  Corneal reflex and gag again were intact.  There is no  Babinski.  Deep tendon reflexes are 1+ throughout.  Coordination not  testable.   ASSESSMENT:  The patient has a longstanding history of complex partial  seizures supposedly developed after a head injury.  This is the first  generalization.  Recent weaning doses of Mysoline with her last dose this  week might have precipitated this as well as a metabolic alkalosis and a  possible urinary tract infection.   PLAN:  The patient will stay on Dr. Sung Amabile' service as long as she is  intubated, which we hope will be for a limited time.  She received IV  Dilantin and we plan to switch her to Dilantin as this is less somatogenic  and easier to maintain during pregnancy.  We will receive her exact medical  dosage through [facility] later today; they are suppose to call us back  within the hour.  I still have to verify why and at what level the patient  takes acetazolamide.  I hope that the patient can be extubated tomorrow and  then changed to a Dilantin and oxcarbazepine regimen for her discharge.  An  MRI is ordered but was not obtained though tonight.  We will hopefully get  it in the early morning hours.  This will be performed with and without  gadolinium and with coronal cuts to evaluate if there is a change in form of  infection, ischemic change or demyelination in the area of the patient's  cortical injuries or elsewhere.  This is also important to evaluate for  possible development of edema or subdural hemorrhage as the patient  supposingly has fallen during her seizure.                                               Melvyn Novas, M.D.    CD/MEDQ  D:  11/10/2002  T:  11/11/2002  Job:  [id number]   cc:   Oley Balm. Sung Amabile, M.D. [facility]  [address]  [place]  [place] [address]  Fax: [phone]

## 2011-04-17 NOTE — Consult Note (Signed)
NAMENOEL, HENANDEZ NO.:  [id number]   MEDICAL RECORD NO.:  [id number]          PATIENT TYPE:  EMS   LOCATION:  MAJO                         FACILITY:  [facility]   PHYSICIAN:  Genene Churn. Love, M.D.    DATE OF BIRTH:  10-18-1971   DATE OF CONSULTATION:  12/14/2006  DATE OF DISCHARGE:                                 CONSULTATION   PATIENT'S ADDRESS:  [address]  [place], [place]  [address].   This 40 year old right-handed white married female is seen in the  emergency room for evaluation of a seizure.   HISTORY OF PRESENT ILLNESS:  Mrs. Sherron Flemings has a history of head trauma  when she fell off of a bridge in 1995, while in [place].  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  She also has  migraine headaches and recently was placed on Topamax 25 mg nightly.   ROUTINE MEDICATIONS:  1. Synthroid 0.125 mg daily.  2. Trileptal 750 mg b.i.d.  3. Phenytoin 300 mg nightly.  4. Dilantin 50 mg nightly.  5. Topamax 25 mg nightly.   She was talking.  She has had approximately nine sensory seizures in the  month of January.  Today she was at rehab center about 5 o'clock and had  a witnessed generalized major motor seizure without warning.  She had  bitten her tongue, there was no urinary or bowel incontinence and she  was brought to the emergency room.  Her electrolytes were normal except  for serum of 130 and a phenytoin level is pending.   PHYSICAL EXAMINATION:  GENERAL APPEARANCE:  A well-developed female.  VITAL SIGNS:  Blood pressure right and left arm 120/80, heart rate 64,  no bruits.  NEUROLOGIC:  Mental status:  Alert and oriented x3.  Mini-mental status  examination 28/30.  Visual fields full.  Disks flat.   Spontaneous venous  pulsation seen.  Extraocular which full.  Corneals present.  No facial  motor asymmetry.  Hearing present.  Air conduction greater than bone  conduction.  Tongue midline.  Uvula midline.  Gags present.  Sternocleidomastoid and trapezius tests normal.  Motor examination 5/5  strength proximally and distally in the upper and lower extremities.  No  evidence of proximal pronator or distal drift.  Coordination testing  normal.  Sensory examination intact.  Deep tendon reflexes 2+.  Plantar  responses downgoing.  Gait examination normal.   LABORATORY DATA:  Sodium 130, potassium of 5, chloride 102, BUN 8,  creatinine 0.8, glucose 93.  Hemoglobin 15.3, hematocrit 45.  Phenytoin  level pending.   IMPRESSION:  1. Generalized major motor seizure, code 345.1.  2. History of sensory right brain uncontrolled seizures, code 341.41.  3. History of head trauma with prolonged coma, code 850.3.   RECOMMENDATIONS:  No driving, obtain a phenytoin level to adjust  medications, have her return to see Dr. Vickey Huger,  her personal  physician.           ______________________________  Genene Churn. Sandria Manly, M.D.     JML/MEDQ  D:  12/14/2006  T:  12/15/2006  Job:  [id number]

## 2011-04-17 NOTE — Consult Note (Signed)
[name], [name] NO.:  [id number]   MEDICAL RECORD NO.:  [id number]          PATIENT TYPE:  EMS   LOCATION:  MAJO                         FACILITY:  [facility]   PHYSICIAN:  Genene Churn. Love, M.D.    DATE OF BIRTH:  Jan 13, 1971   DATE OF CONSULTATION:  DATE OF DISCHARGE:                                 CONSULTATION   ADDENDUM:  Her phenytoin level came back approximately 7.  It was  decided to increase her phenytoin to 400 mg at night with 50 mg of  Dilantin Infatabs.  She was discharged on that increase in medications  and not to drive a car.           ______________________________  Genene Churn. Sandria Manly, M.D.     JML/MEDQ  D:  12/14/2006  T:  12/15/2006  Job:  [id number]
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
## 2011-04-17 NOTE — Discharge Summary (Signed)
NAME:  [name], [name]                   ACCOUNT NO.:  [id number]   MEDICAL RECORD NO.:  [id number]                   PATIENT TYPE:  INP   LOCATION:  3111                                 FACILITY:  [facility]   PHYSICIAN:  Deanna Artis. Sharene Skeans, M.D.           DATE OF BIRTH:  05-Jan-1971   DATE OF ADMISSION:  11/10/2002  DATE OF DISCHARGE:  11/13/2002                                 DISCHARGE SUMMARY   FINAL DIAGNOSES:  1. Status epilepticus, 345.3.  2. Intractable simple and complex partial seizures, 345.5, 134.11.   PROCEDURE:  1. Intubation and ventilation for respiratory failure.  2. MRI scan of the brain.   COMPLICATIONS:  None.   HISTORY OF PRESENT ILLNESS:  The patient is a 40 year old right-handed  Caucasian single woman who has had a prolonged history of simple and complex  partial seizures since a traumatic brain injury in 1995.  The patient was in  a coma for some time.   The patient was shopping and had a generalized tonic-clonic seizure that  persisted and was admitted with status epilepticus.   The patient has recently become engaged to be married.  She wanted to come  off of Mysoline because of its potential teratogenic effects.  We  discontinued her from 125 mg q.d. to none a few days ago.  While shopping  today, the patient had onset of prolonged seizure that caused respiratory  embarrassment.  The pH was 7.24, pCO2 36, pO2 180, then was placed on a  ventilator.   CT scan of the brain in the emergency room was negative.  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   The patient was able to be extubated.  She was confused on day #2 and day #3  of the hospitalization.  This morning, she was awake and alert, and has  returned to her baseline.  Interestingly, she has not had any simple or  complex partial seizures since starting Dilantin.  We did not restart  primidone.   PHYSICAL EXAMINATION:   VITAL SIGNS:  Temperature 99.4, blood pressure  110/60, resting pulse 104, respirations 18, pulse oximetry 98%.  HEENT:  No sinus or ear infections.  NECK:  Supple, full range of motion.  No cranial or cervical bruits.  LUNGS:  Clear.  HEART:  No murmurs, pulses normal.  ABDOMEN:  Soft, bowel sounds normal.  EXTREMITIES:  Unremarkable.  NEUROLOGIC:  Awake, the patient is able to name objects and follow commands.  Her memory is poor.  Cranial nerves:  Pupils equal, round, reactive.  Visual  fields full.  She complains of diplopia that is worse in superior and  inferior gaze.  She wears a prism.  She thinks that her vision and diplopia  is worse.  Symmetric facial strength.  Midline tongue and uvula.  Air  conduction greater then bone conduction bilaterally.  Motor examination:  The patient had no  drift, fine motor movements were normal.  Sensory  examination was okay.  Gait was cautious, but there was no focality.   The patient is discharged today.  Her parents and step-parents (total of  four adults) in the room who had questions.  I answered them as best as  possible.  We decided to discharge her on Trileptal and Dilantin.  There is  some question about how much she should be taking.  Currently, she takes  Trileptal 150 mg tablets 3-1/2 p.o. b.i.d., Diamox 250 mg tablets one daily,  and we will start Dilantin 100 mg tablets three at bedtime.  Her most recent  Dilantin level was 9.0 on this dose.   LABORATORY DATA:  Other laboratory studies have been summarized in the  consultation note by Dr. Vickey Huger.  I will refer the reader to that.   CONDITION ON DISCHARGE:  The patient is discharged in improved condition.  Her parents were advised that if she has a single seizure of brief duration  that our office should be called during the office hours.  If she has a  prolonged seizure greater then 5 minutes, EMS should be called and she  should be brought to the hospital.  She is not to drive  for the next six  months.  She can pursue other activities that she has, although she should  do so with supervision at least in the new run.  We will plan to see her  back in 1/04.  She will need to be worked in, phone number [phone].  Prescription was issued for Dilantin.  The patient has Trileptal and Diamox.                                                 Deanna Artis. Sharene Skeans, M.D.    [facility]  D:  11/13/2002  T:  11/13/2002  Job:  [id number]

## 2011-06-08 ENCOUNTER — Other Ambulatory Visit (HOSPITAL_COMMUNITY)
Admission: RE | Admit: 2011-06-08 | Discharge: 2011-06-08 | Disposition: A | Discharge status: Home / Self Care | Payer: Medicare Other | Source: Ambulatory Visit | Attending: Gynecology | Admitting: Gynecology

## 2011-06-08 ENCOUNTER — Ambulatory Visit (INDEPENDENT_AMBULATORY_CARE_PROVIDER_SITE_OTHER): Payer: Medicare Other | Admitting: Gynecology

## 2011-06-08 ENCOUNTER — Other Ambulatory Visit: Payer: Self-pay | Admitting: Gynecology

## 2011-06-08 DIAGNOSIS — N938 Other specified abnormal uterine and vaginal bleeding: Secondary | ICD-10-CM

## 2011-06-08 DIAGNOSIS — E039 Hypothyroidism, unspecified: Secondary | ICD-10-CM

## 2011-06-08 DIAGNOSIS — N949 Unspecified condition associated with female genital organs and menstrual cycle: Secondary | ICD-10-CM

## 2011-06-08 DIAGNOSIS — N870 Mild cervical dysplasia: Secondary | ICD-10-CM

## 2011-06-08 DIAGNOSIS — N925 Other specified irregular menstruation: Secondary | ICD-10-CM

## 2011-06-08 DIAGNOSIS — R635 Abnormal weight gain: Secondary | ICD-10-CM

## 2011-06-08 DIAGNOSIS — R87619 Unspecified abnormal cytological findings in specimens from cervix uteri: Secondary | ICD-10-CM | POA: Insufficient documentation

## 2011-06-12 ENCOUNTER — Other Ambulatory Visit: Payer: Medicare Other

## 2011-06-15 ENCOUNTER — Other Ambulatory Visit (INDEPENDENT_AMBULATORY_CARE_PROVIDER_SITE_OTHER): Payer: Medicare Other

## 2011-06-15 DIAGNOSIS — E039 Hypothyroidism, unspecified: Secondary | ICD-10-CM

## 2011-07-13 ENCOUNTER — Other Ambulatory Visit: Payer: Medicare Other

## 2011-08-22 ENCOUNTER — Emergency Department (HOSPITAL_COMMUNITY)
Admission: EM | Admit: 2011-08-22 | Discharge: 2011-08-23 | Disposition: A | Discharge status: Home / Self Care | Payer: Medicare Other | Attending: Emergency Medicine | Admitting: Emergency Medicine

## 2011-08-22 DIAGNOSIS — R404 Transient alteration of awareness: Secondary | ICD-10-CM | POA: Insufficient documentation

## 2011-08-22 DIAGNOSIS — R569 Unspecified convulsions: Secondary | ICD-10-CM | POA: Insufficient documentation

## 2011-08-22 LAB — POCT I-STAT, CHEM 8
BUN: 8 mg/dL (ref 6–23)
Calcium, Ion: 1.11 mmol/L — ABNORMAL LOW (ref 1.12–1.32)
Chloride: 106 mEq/L (ref 96–112)
Creatinine, Ser: 0.9 mg/dL (ref 0.50–1.10)
Glucose, Bld: 110 mg/dL — ABNORMAL HIGH (ref 70–99)
HCT: 43 % (ref 36.0–46.0)
Hemoglobin: 14.6 g/dL (ref 12.0–15.0)
Potassium: 3.4 mEq/L — ABNORMAL LOW (ref 3.5–5.1)
Sodium: 140 mEq/L (ref 135–145)
TCO2: 20 mmol/L (ref 0–100)

## 2011-08-22 LAB — GLUCOSE, CAPILLARY: Glucose-Capillary: 103 mg/dL — ABNORMAL HIGH (ref 70–99)

## 2011-08-24 ENCOUNTER — Ambulatory Visit (INDEPENDENT_AMBULATORY_CARE_PROVIDER_SITE_OTHER): Payer: Medicare Other | Admitting: Gynecology

## 2011-08-24 ENCOUNTER — Encounter: Payer: Self-pay | Admitting: Gynecology

## 2011-08-24 DIAGNOSIS — S0990XA Unspecified injury of head, initial encounter: Secondary | ICD-10-CM | POA: Insufficient documentation

## 2011-08-24 DIAGNOSIS — B373 Candidiasis of vulva and vagina: Secondary | ICD-10-CM

## 2011-08-24 DIAGNOSIS — R569 Unspecified convulsions: Secondary | ICD-10-CM | POA: Insufficient documentation

## 2011-08-24 DIAGNOSIS — N898 Other specified noninflammatory disorders of vagina: Secondary | ICD-10-CM

## 2011-08-24 DIAGNOSIS — N949 Unspecified condition associated with female genital organs and menstrual cycle: Secondary | ICD-10-CM

## 2011-08-24 DIAGNOSIS — B3731 Acute candidiasis of vulva and vagina: Secondary | ICD-10-CM

## 2011-08-24 DIAGNOSIS — E039 Hypothyroidism, unspecified: Secondary | ICD-10-CM | POA: Insufficient documentation

## 2011-08-24 MED ORDER — FLUCONAZOLE 150 MG PO TABS: 150.0000 mg | ORAL_TABLET | Freq: Once | ORAL | Status: AC

## 2011-08-24 NOTE — Progress Notes (Signed)
Patient presents complaining of one-week history of vaginal discharge with some irritation. She also has chronic left lower quadrant discomfort that had a complete workup at [facility] by Dr. Sherral Hammers and ultimately underwent a laparoscopic LSO.  Exam Abdomen: Soft nontender without masses guarding rebound organomegaly Pelvic: External BUS vagina White thick discharge KOH wet prep done, cervix normal, uterus normal size midline mobile nontender, adnexa without masses or tenderness  Assessment and plan: wet prep is positive for yeast we'll treat with Diflucan 150x1 dose, follow up if symptoms persist or recur. Patient does have history of low-grade SIL Pap smear with colposcopy January 2012 with ECC was benign, followup Pap smear at her annual July 2012 was negative have reminded her to followup in August for a six-month followup Pap smear. We printed this out on the AVS so that she has a paper copy of instructions as she does have a memory deficit issue.

## 2011-09-01 LAB — POCT I-STAT, CHEM 8
BUN: 6
Calcium, Ion: 1.18
Chloride: 100
Creatinine, Ser: 0.8
Glucose, Bld: 107 — ABNORMAL HIGH
HCT: 40
Hemoglobin: 13.6
Potassium: 3.6
Sodium: 137
TCO2: 26

## 2011-09-01 LAB — PHENYTOIN LEVEL, TOTAL: Phenytoin Lvl: 12.5

## 2011-09-03 LAB — CBC
HCT: 39.9 % (ref 36.0–46.0)
Hemoglobin: 13.4 g/dL (ref 12.0–15.0)
MCHC: 33.5 g/dL (ref 30.0–36.0)
MCV: 93.3 fL (ref 78.0–100.0)
Platelets: 214 10*3/uL (ref 150–400)
RBC: 4.28 MIL/uL (ref 3.87–5.11)
RDW: 13.6 % (ref 11.5–15.5)
WBC: 9.6 10*3/uL (ref 4.0–10.5)

## 2011-09-03 LAB — BASIC METABOLIC PANEL
BUN: 5 mg/dL — ABNORMAL LOW (ref 6–23)
CO2: 22 mEq/L (ref 19–32)
Calcium: 8.5 mg/dL (ref 8.4–10.5)
Chloride: 97 mEq/L (ref 96–112)
Creatinine, Ser: 0.55 mg/dL (ref 0.4–1.2)
GFR calc Af Amer: >60 mL/min (ref >60)
GFR calc non Af Amer: >60 mL/min (ref >60)
Glucose, Bld: 111 mg/dL — ABNORMAL HIGH (ref 70–99)
Potassium: 3.2 mEq/L — ABNORMAL LOW (ref 3.5–5.1)
Sodium: 128 mEq/L — ABNORMAL LOW (ref 135–145)

## 2011-09-03 LAB — URINALYSIS, ROUTINE W REFLEX MICROSCOPIC
Bilirubin Urine: NEGATIVE
Glucose, UA: NEGATIVE mg/dL
Hgb urine dipstick: NEGATIVE
Ketones, ur: NEGATIVE mg/dL
Nitrite: NEGATIVE
Protein, ur: NEGATIVE mg/dL
Specific Gravity, Urine: 1.008 (ref 1.005–1.030)
Urobilinogen, UA: 0.2 mg/dL (ref 0.0–1.0)
pH: 7.5 (ref 5.0–8.0)

## 2011-09-03 LAB — DIFFERENTIAL
Basophils Absolute: 0 10*3/uL (ref 0.0–0.1)
Basophils Relative: 0 % (ref 0–1)
Eosinophils Absolute: 0.1 10*3/uL (ref 0.0–0.7)
Eosinophils Relative: 1 % (ref 0–5)
Lymphocytes Relative: 20 % (ref 12–46)
Lymphs Abs: 1.9 10*3/uL (ref 0.7–4.0)
Monocytes Absolute: 0.5 10*3/uL (ref 0.1–1.0)
Monocytes Relative: 5 % (ref 3–12)
Neutro Abs: 7.1 10*3/uL (ref 1.7–7.7)
Neutrophils Relative %: 74 % (ref 43–77)

## 2011-09-03 LAB — PHENYTOIN LEVEL, TOTAL: Phenytoin Lvl: 11.6 ug/mL (ref 10.0–20.0)

## 2011-12-01 ENCOUNTER — Emergency Department (HOSPITAL_COMMUNITY)
Admission: EM | Admit: 2011-12-01 | Discharge: 2011-12-01 | Disposition: A | Discharge status: Home / Self Care | Payer: Medicare Other | Attending: Emergency Medicine | Admitting: Emergency Medicine

## 2011-12-01 ENCOUNTER — Encounter (HOSPITAL_COMMUNITY): Payer: Self-pay | Admitting: Emergency Medicine

## 2011-12-01 DIAGNOSIS — Z79899 Other long term (current) drug therapy: Secondary | ICD-10-CM | POA: Insufficient documentation

## 2011-12-01 DIAGNOSIS — Z9889 Other specified postprocedural states: Secondary | ICD-10-CM | POA: Insufficient documentation

## 2011-12-01 DIAGNOSIS — R404 Transient alteration of awareness: Secondary | ICD-10-CM | POA: Insufficient documentation

## 2011-12-01 DIAGNOSIS — R569 Unspecified convulsions: Secondary | ICD-10-CM | POA: Insufficient documentation

## 2011-12-01 DIAGNOSIS — R000 Tachycardia, unspecified: Secondary | ICD-10-CM | POA: Insufficient documentation

## 2011-12-01 LAB — POCT I-STAT, CHEM 8
BUN: 9 mg/dL (ref 6–23)
Calcium, Ion: 1.21 mmol/L (ref 1.12–1.32)
Chloride: 102 mEq/L (ref 96–112)
Creatinine, Ser: 0.9 mg/dL (ref 0.50–1.10)
Glucose, Bld: 109 mg/dL — ABNORMAL HIGH (ref 70–99)
HCT: 44 % (ref 36.0–46.0)
Hemoglobin: 15 g/dL (ref 12.0–15.0)
Potassium: 4.2 mEq/L (ref 3.5–5.1)
Sodium: 138 mEq/L (ref 135–145)
TCO2: 28 mmol/L (ref 0–100)

## 2011-12-01 NOTE — ED Notes (Signed)
Pt c/o sensory seizures that usually prelude a big seizure; pt here proactively to get labs to see if levels are off

## 2011-12-01 NOTE — ED Provider Notes (Signed)
History     CSN: [id number]  Arrival date & time 12/01/11  1555   First MD Initiated Contact with Patient 12/01/11 1658      Chief Complaint  Patient presents with  . Seizures    (Consider location/radiation/quality/duration/timing/severity/associated sxs/prior treatment) Patient is a 41 y.o. female presenting with seizures. The history is provided by the patient.  Seizures  This is a recurrent problem. The current episode started 1 to 2 hours ago. The problem has been resolved. There were 2 to 3 seizures. The most recent episode lasted less than 30 seconds. Associated symptoms include sleepiness. Pertinent negatives include no confusion, no headaches, no visual disturbance, no neck stiffness, no chest pain, no cough, no vomiting, no diarrhea and no muscle weakness. Characteristics include eye blinking. Characteristics do not include bowel incontinence, bladder incontinence, rhythmic jerking, loss of consciousness or apnea. States that she stares off into the distance and has brilliant thoughts after they occur she cannot remember The episode was witnessed. There was the sensation of an aura present. The seizures did not continue in the ED. The seizure(s) had no focality. Possible causes include sleep deprivation and change in alcohol use. Possible causes do not include med or dosage change, missed seizure meds or recent illness. There has been no fever. Meds prior to arrival: She took oral alprazolam.    Past Medical History  Diagnosis Date  . Closed head injury 1995  . Seizure   . Hypothyroid   . Abnormal laboratory test result 05/2010    TESTOSTERONE LEVEL 142NG/DL  . 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 Hysteroscopy 2008  . Pelvic laparoscopy 2008  . Oophorectomy 01/2008    LAP LSO, [facility] [place] [facility]    History reviewed. No pertinent family history.  History  Substance Use  Topics  . Smoking status: Never Smoker   . Smokeless tobacco: Never Used  . Alcohol Use: No     very little    OB History    Grav Para Term Preterm Abortions TAB SAB Ect Mult Living   0               Review of Systems  Eyes: Negative for visual disturbance.  Respiratory: Negative for apnea and cough.   Cardiovascular: Negative for chest pain.  Gastrointestinal: Negative for vomiting, diarrhea and bowel incontinence.  Genitourinary: Negative for bladder incontinence.  Neurological: Positive for seizures. Negative for loss of consciousness and headaches.  Psychiatric/Behavioral: Negative for confusion.  All other systems reviewed and are negative.    Allergies  Other; Oxycodone; and Ibuprofen  Home Medications   Current Outpatient Rx  Name Route Sig Dispense Refill  . ACETAZOLAMIDE 250 MG PO TABS Oral Take 125 mg by mouth.     Marland Kitchen VITAMIN D PO Oral Take by mouth.      Marland Kitchen LAMOTRIGINE 100 MG PO TABS Oral Take 200 mg by mouth 2 (two) times daily.     Marland Kitchen LEVETIRACETAM 1000 MG PO TABS Oral Take 1,000 mg by mouth 2 (two) times daily.      Marland Kitchen LEVOTHYROXINE SODIUM 125 MCG PO TABS Oral Take 125 mcg by mouth daily.      . MULTIVITAMIN/IRON PO Oral Take 1 tablet by mouth daily.     Marland Kitchen OXCARBAZEPINE 600 MG PO TABS Oral Take 300-600 mg by mouth 2 (two) times daily. Takes 300mg  every morning and 600mg   in the evening      BP 121/83  Pulse 117  Temp(Src) 98.2 F (36.8 C) (Oral)  Resp 19  SpO2 99%  Physical Exam  Nursing note and vitals reviewed. Constitutional: She is oriented to person, place, and time. She appears well-developed and well-nourished. No distress.  HENT:  Head: Normocephalic and atraumatic.  Eyes: EOM are normal. Pupils are equal, round, and reactive to light.  Cardiovascular: Regular rhythm, normal heart sounds and intact distal pulses.  Tachycardia present.  Exam reveals no friction rub.   No murmur heard. Pulmonary/Chest: Effort normal and breath sounds normal. She  has no wheezes. She has no rales.  Abdominal: Soft. Bowel sounds are normal. She exhibits no distension. There is no tenderness. There is no rebound and no guarding.  Musculoskeletal: Normal range of motion. She exhibits no tenderness.       No edema  Neurological: She is alert and oriented to person, place, and time. No cranial nerve deficit.  Skin: Skin is warm and dry. No rash noted.  Psychiatric: She has a normal mood and affect. Her behavior is normal.    ED Course  Procedures (including critical care time)  Labs Reviewed  POCT I-STAT, CHEM 8 - Abnormal; Notable for the following:    Glucose, Bld 109 (*)    All other components within normal limits  I-STAT, CHEM 8   No results found.   No diagnosis found.    MDM   Patient with history of seizures. She had 2 small seizures today which lasted only 10-15 seconds. No grand mal seizures. She took alprazolam after the episode started alprazolam or seizures. She has not missed any medication doses. But states yesterday she did drink some alcohol and tobacco as much sleep last night. She and her family were just concerned that she may have another seizure and wanted her sodium checked as she had a history of hyponatremia in the past. Normal exam here no deficits. I-STAT within normal sodium of 138. Will have her follow up with her neurologist for Trileptal levels however the dosing has not changed.        Gwyneth Sprout, MD 12/01/11 1836

## 2012-04-26 ENCOUNTER — Telehealth: Payer: Self-pay | Admitting: *Deleted

## 2012-04-26 NOTE — Telephone Encounter (Signed)
Patient is overdue for her annual exam as well as Pap smear due to her low-grade dysplasia. Recommend we start there with annual exam and then we'll go from there.
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
## 2012-04-27 NOTE — Telephone Encounter (Signed)
Pt husband informed with the below note and will make appointment for pt.

## 2012-05-03 ENCOUNTER — Ambulatory Visit (INDEPENDENT_AMBULATORY_CARE_PROVIDER_SITE_OTHER): Payer: Medicare Other | Admitting: Gynecology

## 2012-05-03 ENCOUNTER — Encounter: Payer: Self-pay | Admitting: Gynecology

## 2012-05-03 ENCOUNTER — Other Ambulatory Visit (HOSPITAL_COMMUNITY)
Admission: RE | Admit: 2012-05-03 | Discharge: 2012-05-03 | Disposition: A | Discharge status: Home / Self Care | Payer: Medicare Other | Source: Ambulatory Visit | Attending: Gynecology | Admitting: Gynecology

## 2012-05-03 VITALS — BP 126/70 | Ht 66.0 in | Wt 178.0 lb

## 2012-05-03 DIAGNOSIS — Z13220 Encounter for screening for lipoid disorders: Secondary | ICD-10-CM

## 2012-05-03 DIAGNOSIS — R5383 Other fatigue: Secondary | ICD-10-CM

## 2012-05-03 DIAGNOSIS — R5381 Other malaise: Secondary | ICD-10-CM

## 2012-05-03 DIAGNOSIS — Z1159 Encounter for screening for other viral diseases: Secondary | ICD-10-CM | POA: Insufficient documentation

## 2012-05-03 DIAGNOSIS — Z01419 Encounter for gynecological examination (general) (routine) without abnormal findings: Secondary | ICD-10-CM

## 2012-05-03 DIAGNOSIS — E039 Hypothyroidism, unspecified: Secondary | ICD-10-CM

## 2012-05-03 LAB — COMPREHENSIVE METABOLIC PANEL
ALT: 13 U/L (ref 0–35)
AST: 15 U/L (ref 0–37)
Albumin: 4.5 g/dL (ref 3.5–5.2)
Alkaline Phosphatase: 52 U/L (ref 39–117)
BUN: 10 mg/dL (ref 6–23)
CO2: 27 mEq/L (ref 19–32)
Calcium: 9.9 mg/dL (ref 8.4–10.5)
Chloride: 103 mEq/L (ref 96–112)
Creat: 0.84 mg/dL (ref 0.50–1.10)
Glucose, Bld: 89 mg/dL (ref 70–99)
Potassium: 4.6 mEq/L (ref 3.5–5.3)
Sodium: 138 mEq/L (ref 135–145)
Total Bilirubin: 0.4 mg/dL (ref 0.3–1.2)
Total Protein: 6.8 g/dL (ref 6.0–8.3)

## 2012-05-03 LAB — CBC WITH DIFFERENTIAL/PLATELET
Basophils Absolute: 0 10*3/uL (ref 0.0–0.1)
Basophils Relative: 1 % (ref 0–1)
Eosinophils Absolute: 0.1 10*3/uL (ref 0.0–0.7)
Eosinophils Relative: 1 % (ref 0–5)
HCT: 41.8 % (ref 36.0–46.0)
Hemoglobin: 13.8 g/dL (ref 12.0–15.0)
Lymphocytes Relative: 30 % (ref 12–46)
Lymphs Abs: 1.9 10*3/uL (ref 0.7–4.0)
MCH: 29.8 pg (ref 26.0–34.0)
MCHC: 33 g/dL (ref 30.0–36.0)
MCV: 90.3 fL (ref 78.0–100.0)
Monocytes Absolute: 0.2 10*3/uL (ref 0.1–1.0)
Monocytes Relative: 3 % (ref 3–12)
Neutro Abs: 4.2 10*3/uL (ref 1.7–7.7)
Neutrophils Relative %: 65 % (ref 43–77)
Platelets: 283 10*3/uL (ref 150–400)
RBC: 4.63 MIL/uL (ref 3.87–5.11)
RDW: 13.6 % (ref 11.5–15.5)
WBC: 6.4 10*3/uL (ref 4.0–10.5)

## 2012-05-03 LAB — LIPID PANEL
Cholesterol: 193 mg/dL (ref 0–200)
HDL: 58 mg/dL (ref >39)
LDL Cholesterol: 120 mg/dL — ABNORMAL HIGH (ref 0–99)
Total CHOL/HDL Ratio: 3.3 Ratio
Triglycerides: 77 mg/dL (ref <150)
VLDL: 15 mg/dL (ref 0–40)

## 2012-05-03 LAB — TSH: TSH: 1.282 u[IU]/mL (ref 0.350–4.500)

## 2012-05-03 NOTE — Patient Instructions (Signed)
Follow up for blood results. Consider more reliable contraception such as vasectomy or IUD. Follow up in one year for annual gynecologic exam.

## 2012-05-03 NOTE — Progress Notes (Signed)
[name] 1970-12-23 [id number]        41 y.o.  for annual exam.  Several issues noted below.  Past medical history,surgical history, medications, allergies, family history and social history were all reviewed and documented in the EPIC chart. ROS:  Was performed and pertinent positives and negatives are included in the history.  Exam: Sherrilyn Rist chaperone present Filed Vitals:   05/03/12 1145  BP: 126/70   General appearance  Normal Skin grossly normal Head/Neck normal with no cervical or supraclavicular adenopathy thyroid normal Lungs  clear Cardiac RR, without RMG Abdominal  soft, nontender, without masses, organomegaly or hernia Breasts  examined lying and sitting without masses, retractions, discharge or axillary adenopathy. Pelvic  Ext/BUS/vagina  normal   Cervix  normal Pap done  Uterus  anteverted, normal size, shape and contour, midline and mobile nontender   Adnexa  Without masses or tenderness    Anus and perineum  normal   Rectovaginal  normal sphincter tone without palpated masses or tenderness.    Assessment/Plan:  41 y.o. female for annual exam.    1. History low-grade SIL 2011, an adequate colposcopy with normal ECC. Pap smear 7 2012 normal. Pap done today with high-risk HPV screen. 2. Complaints of fatigue and weight gain. Patient is being treated for hypothyroidism and I checked a TSH today along with a comprehensive metabolic panel and CBC. Patient asked that I could prescribe her Synthroid and when do that pending her TSH results. 3. Mammography. Patient is not schedule her mammogram. We gave her written instructions with the facilities to call if she agrees to do so. SBE monthly reviewed. 4. Contraception. I again discussed birth control with her. She cannot tolerate hormone contraception as apparently it exacerbates her seizure activity. I reviewed alternative such as IUD and sterilization. I encouraged her to discuss vasectomy with her husband. She  understands that by using withdrawal method and condoms if she is at increased risk for pregnancy but again with her medications and she understands and accepts this risk. Plan B back up reviewed. 5. Health maintenance. Lipid profile, urinalysis ordered with above blood work.    Dara Lords MD, 12:47 PM 05/03/2012

## 2012-05-04 LAB — URINALYSIS W MICROSCOPIC + REFLEX CULTURE
Bacteria, UA: NONE SEEN
Bilirubin Urine: NEGATIVE
Casts: NONE SEEN
Crystals: NONE SEEN
Glucose, UA: NEGATIVE mg/dL
Hgb urine dipstick: NEGATIVE
Ketones, ur: NEGATIVE mg/dL
Leukocytes, UA: NEGATIVE
Nitrite: NEGATIVE
Protein, ur: NEGATIVE mg/dL
Specific Gravity, Urine: 1.011 (ref 1.005–1.030)
Urobilinogen, UA: 0.2 mg/dL (ref 0.0–1.0)
pH: 8 (ref 5.0–8.0)

## 2012-05-05 ENCOUNTER — Telehealth: Payer: Self-pay | Admitting: *Deleted

## 2012-05-05 NOTE — Telephone Encounter (Signed)
Pt called back wanting numbers of normal lab results.

## 2012-09-07 ENCOUNTER — Telehealth: Payer: Self-pay | Admitting: *Deleted

## 2012-09-07 ENCOUNTER — Emergency Department (HOSPITAL_COMMUNITY)
Admission: EM | Admit: 2012-09-07 | Discharge: 2012-09-07 | Discharge status: Against Medical Advice | Payer: Medicare Other | Attending: Emergency Medicine | Admitting: Emergency Medicine

## 2012-09-07 ENCOUNTER — Encounter (HOSPITAL_COMMUNITY): Payer: Self-pay | Admitting: Emergency Medicine

## 2012-09-07 DIAGNOSIS — R42 Dizziness and giddiness: Secondary | ICD-10-CM | POA: Insufficient documentation

## 2012-09-07 LAB — GLUCOSE, CAPILLARY: Glucose-Capillary: 94 mg/dL (ref 70–99)
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
## 2012-09-07 NOTE — ED Notes (Signed)
Was going to funeral and got dizzy and tingling  Was driving states ate breakfast

## 2012-09-07 NOTE — Telephone Encounter (Signed)
Pt called c/o heavy 4 day cycle, LMP:09/03/12 changing pads very 2 hours, passing clots. Pt went to ED this am c/o dizziness and feeling weak. Pt said she sat in the ER for 3 hour and signed herself out to leave. No iv fluid, no blood work anything was done, pt said she felt better so she left. She though maybe the heavy bleeding could have caused her to feel weak, pt said he cycles are normally okay. Please advise

## 2012-09-08 NOTE — Telephone Encounter (Signed)
If bleeding better than let's see what her next period does, if heavy then office visit. If still bleeding heavy and or irregular now then office visit.

## 2012-09-08 NOTE — Telephone Encounter (Signed)
Pt informed with the below note. She will follow up as needed. 

## 2012-10-05 ENCOUNTER — Ambulatory Visit: Payer: Medicare Other | Admitting: Gynecology

## 2012-12-20 ENCOUNTER — Encounter: Payer: Self-pay | Admitting: Gynecology

## 2012-12-20 ENCOUNTER — Ambulatory Visit (INDEPENDENT_AMBULATORY_CARE_PROVIDER_SITE_OTHER): Payer: Medicare Other | Admitting: Gynecology

## 2012-12-20 DIAGNOSIS — N920 Excessive and frequent menstruation with regular cycle: Secondary | ICD-10-CM

## 2012-12-20 DIAGNOSIS — N926 Irregular menstruation, unspecified: Secondary | ICD-10-CM

## 2012-12-20 LAB — CBC WITH DIFFERENTIAL/PLATELET
Basophils Absolute: 0 10*3/uL (ref 0.0–0.1)
Basophils Relative: 0 % (ref 0–1)
Eosinophils Absolute: 0.1 10*3/uL (ref 0.0–0.7)
Eosinophils Relative: 1 % (ref 0–5)
HCT: 39.1 % (ref 36.0–46.0)
Hemoglobin: 13.2 g/dL (ref 12.0–15.0)
Lymphocytes Relative: 33 % (ref 12–46)
Lymphs Abs: 1.9 10*3/uL (ref 0.7–4.0)
MCH: 29.8 pg (ref 26.0–34.0)
MCHC: 33.8 g/dL (ref 30.0–36.0)
MCV: 88.3 fL (ref 78.0–100.0)
Monocytes Absolute: 0.4 10*3/uL (ref 0.1–1.0)
Monocytes Relative: 6 % (ref 3–12)
Neutro Abs: 3.4 10*3/uL (ref 1.7–7.7)
Neutrophils Relative %: 60 % (ref 43–77)
Platelets: 263 10*3/uL (ref 150–400)
RBC: 4.43 MIL/uL (ref 3.87–5.11)
RDW: 13.3 % (ref 11.5–15.5)
WBC: 5.7 10*3/uL (ref 4.0–10.5)

## 2012-12-20 LAB — FOLLICLE STIMULATING HORMONE: FSH: 9.8 m[IU]/mL

## 2012-12-20 LAB — PROLACTIN: Prolactin: 5.4 ng/mL

## 2012-12-20 LAB — TSH: TSH: 1.246 u[IU]/mL (ref 0.350–4.500)

## 2012-12-20 NOTE — Progress Notes (Signed)
Patient presents complaining of heavy irregular menses. Over the last 4-6 months her periods have gotten heavier where she has frequent pad changes passing clots. Also having some bleeding in between her periods. She has a very complex history to include seizure disorder where hormone treatment is contraindicated as it increases seizure activity. Also has a history of endometriosis with initial laparoscopy 2008. She had a follow up laparoscopy at [facility] by Dr. Fredrich Birks where she had a laparoscopic left salpingo-oophorectomy 2009.  She does note that she's not having a lot of pain but just the heavy irregular bleeding.  Exam was kim assistant Abdomen soft nontender without masses guarding rebound organomegaly. Pelvic external BUS vagina with scant bleeding. Cervix normal. Uterus normal size midline mobile nontender. Adnexa without masses or tenderness.  Assessment and plan menorrhagia/irregular menses. Start with baseline labs to include CBC TSH FSH prolactin and sonohysterogram. Given her situation I think her choices are limited for treatment. She cannot use hormones to include the Mirena IUD. Currently using condoms for contraception. Options for ablation discussed that she would need assured contraception with this. I think ultimately hysterectomy is probably her best choice. I think we could at least initially approach vaginally with a laparoscopic back up. I will review her last operative report from [facility] for their description of pelvic anatomy. Patient will follow up for the above studies and then we'll go from there.

## 2012-12-20 NOTE — Patient Instructions (Signed)
We will draw blood work to check your hormone levels. You will follow up for an ultrasound in our office as scheduled We talked about options for treatment for your heavy irregular periods to include observation, hormonal manipulation, IUD, hysterectomy. I think given the total picture that hysterectomy is going to be the best option and we will talk more about this after the blood work and ultrasound results.

## 2012-12-21 ENCOUNTER — Encounter: Payer: Self-pay | Admitting: Gynecology

## 2012-12-23 ENCOUNTER — Ambulatory Visit: Payer: Medicare Other | Admitting: Gynecology

## 2012-12-23 ENCOUNTER — Other Ambulatory Visit: Payer: Medicare Other

## 2012-12-28 ENCOUNTER — Ambulatory Visit: Payer: Medicare Other | Admitting: Gynecology

## 2012-12-28 ENCOUNTER — Other Ambulatory Visit: Payer: Medicare Other

## 2013-01-02 ENCOUNTER — Encounter: Payer: Self-pay | Admitting: Gynecology

## 2013-01-02 ENCOUNTER — Ambulatory Visit (INDEPENDENT_AMBULATORY_CARE_PROVIDER_SITE_OTHER): Payer: Medicare Other | Admitting: Gynecology

## 2013-01-02 ENCOUNTER — Ambulatory Visit (INDEPENDENT_AMBULATORY_CARE_PROVIDER_SITE_OTHER): Payer: Medicare Other

## 2013-01-02 ENCOUNTER — Other Ambulatory Visit: Payer: Self-pay | Admitting: Gynecology

## 2013-01-02 DIAGNOSIS — N926 Irregular menstruation, unspecified: Secondary | ICD-10-CM

## 2013-01-02 DIAGNOSIS — G40909 Epilepsy, unspecified, not intractable, without status epilepticus: Secondary | ICD-10-CM

## 2013-01-02 DIAGNOSIS — N920 Excessive and frequent menstruation with regular cycle: Secondary | ICD-10-CM

## 2013-01-02 DIAGNOSIS — N831 Corpus luteum cyst of ovary, unspecified side: Secondary | ICD-10-CM

## 2013-01-02 NOTE — Patient Instructions (Signed)
Office will call you with the uterine biopsy results that were done at the time of your ultrasound.  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 I do not think that it is the source of your heavy periods.  Your hormone levels that were checked last time were all normal.  Your options to treat your heavy periods are watch them and do nothing, hormonal such as birth control pills, Mirena IUD which will make her periods lighter, keep you birth control, but also does have hormone coating that you may absorbed systemically. Implanon rod that goes in the arm, good for 3 years, but it is hormonal. Depo-Provera shots every 3 months but again hormonal. Endometrial ablation where we try to destroy the lining of the uterus so that it does not bleed as heavily and you should never tried to get pregnant after this. A problem with this is that you can become pregnant afterwards although unusual and you would have to use assured birth control such as vasectomy for her husband. Consistent condom use is one possibility but their are failures with this also. We could do a tubal sterilization and ablation at the same time which would keep your uterus.  Lastly hysterectomy with removal of the uterus. I would leave your other ovary for continued hormone production and you would never have problems with bleeding again, I cannot guarantee that it would improve your left-sided pain which may persist after the surgery.  If your husband or you have any questions please call or make an appointment to sit down to talk to me.

## 2013-01-02 NOTE — Progress Notes (Signed)
Patient presents for sonohysterogram with history of heavy irregular menses. Over the last 4-6 months her periods have gotten heavier where she has frequent pad changes passing clots. Also having some bleeding in between her periods. She has a very complex history to include seizure disorder where hormone treatment is contraindicated as it increases seizure activity. Also has a history of endometriosis with initial laparoscopy 2008. She had a follow up laparoscopy at [facility] by Dr. Fredrich Birks where she had a laparoscopic left salpingo-oophorectomy 2009. She does note that she's not having a lot of pain but just the heavy irregular bleeding.  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 Right ovary with corpus luteal cyst, left adnexa negative. Cul-de-sac negative. Sonohysterogram performed, sterile technique, easy catheter introduction, good distention with no abnormalities. Endometrial sample taken. Patient tolerated well.  Assessment and plan: Heavy menses some irregularity although the heaviness is her major complaint. Using barrier/timing contraception. Recent lab work showed normal hemoglobin of 13, FSH/prolactins/TSH all normal. I reviewed options for management of her heavy menses to include pill patch ring, progesterone only such as Implanon, Depo-Provera, Mirena IUD, endometrial ablation, hysterectomy. Patient cannot tolerate hormonal contraception as it leads to increased seizure activity. She is doing well at her current regimen is very reluctant to introduce hormones. I reviewed with pill patch ring Depo-Provera Implanon and Mirena that these are all hormonal. The question is with the Mirena how much progesterone she would absorb them with this effect seizure activity. The need for contraception regardless discussed and with ablation she would need to consider contraception such as vasectomy or possibly tubal sterilization with ablation at the same time. Essure with  ablation separate procedure also discussed. Ultimately hysterectomy as definitive treatment reviewed. I would leave her right ovary for continued hormone production. She is having a chronic recurrent left sided pelvic pain for which she underwent an LSO by Dr. Fredrich Birks at [facility] after pain mapping laparoscopy.  She understands with hysterectomy there would be no guarantees as far as pain relief in her left-sided pain may persist worsen or change but I would plan on a laparoscopic combined hysterectomy to allow for visualization of her pelvis. 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

## 2013-01-06 ENCOUNTER — Telehealth: Payer: Self-pay

## 2013-01-06 NOTE — Telephone Encounter (Signed)
Patient informed that Dr. Velvet Bathe will do hysterectomy laparoscopically but will need to examine her to determine exact method.  I did check ins benefits and estimate costs for her and that info was provided.  We discussed dates and I will proceed to try to schedule her for March. I will call her later to confirm.

## 2013-01-06 NOTE — Telephone Encounter (Signed)
Corrected that is a quality of life decision. I do not think more serious things like cancer is going on. We did a biopsy which was benign.

## 2013-01-06 NOTE — Telephone Encounter (Signed)
I called patient and confirmed surgery date 02/13/13 7:30am at [facility].  Pre-op consult to be scheduled. Transferred her to appt desk.

## 2013-01-06 NOTE — Telephone Encounter (Signed)
Husband, Jonny Ruiz, called to discuss costs of surgery and insurance benefits.  He asked "how urgent is this surgery" ?  I told him I thought that it was more a quality of life decision and I did not think there was anything

## 2013-01-06 NOTE — Telephone Encounter (Signed)
(  continued) urgent about the surgery at this time.  I did tell him I would check with Dr. Velvet Bathe.  He is going to talk with [facility] and said he would call first of the week. Currently Debbe Bales has scheduled surgery for March 17.  He does not want to schedule pre-op consult yet until he considers finances.

## 2013-01-20 NOTE — Progress Notes (Signed)
Dr. Velvet Bathe sent me staff message: Olegario Messier, when patient comes for her preop consult make sure she comes with a complete list of her current medications.      I mailed patient a letter asking that she bring this list with her and stating pre-op consult date. Copy is in e-chart.

## 2013-01-31 ENCOUNTER — Encounter (HOSPITAL_COMMUNITY): Payer: Self-pay | Admitting: Pharmacist

## 2013-02-03 ENCOUNTER — Ambulatory Visit: Payer: Medicare Other | Admitting: Gynecology

## 2013-02-07 ENCOUNTER — Encounter: Payer: Self-pay | Admitting: Gynecology

## 2013-02-07 ENCOUNTER — Ambulatory Visit (INDEPENDENT_AMBULATORY_CARE_PROVIDER_SITE_OTHER): Payer: Medicare Other | Admitting: Gynecology

## 2013-02-07 DIAGNOSIS — N926 Irregular menstruation, unspecified: Secondary | ICD-10-CM

## 2013-02-07 DIAGNOSIS — N920 Excessive and frequent menstruation with regular cycle: Secondary | ICD-10-CM

## 2013-02-07 NOTE — H&P (Signed)
[name] 21-Sep-1971 [id number]   History and Physical  Chief complaint: Heavy irregular bleeding, left pelvic pain  History of present illness: 42 y.o. G0P0 patient presents complaining of heavy irregular menses over the past 6 months. Her periods have gotten heavier where she has frequent pad changes passing clots. Also having some bleeding in between her periods. She has a very complex history to include seizure disorder where hormone treatment is contraindicated as it increases seizure activity. Also has a history of endometriosis with initial laparoscopy 2008. She had a follow up laparoscopy at [facility] by Dr. Fredrich Birks where she had a laparoscopic left salpingo-oophorectomy 2009. Recent lab work to include [facility] prolactin TSH were normal and she had a negative sonohysterogram with negative endometrial biopsy.  I reviewed options for management of her heavy menses with her and her husband to include pill patch ring, progesterone only such as Implanon, Depo-Provera, Depo-Lupron, Mirena IUD, endometrial ablation, hysterectomy. Patient cannot tolerate hormonal contraception as it leads to increased seizure activity. She is doing well at her current regimen and is very reluctant to introduce hormones. I reviewed the options for management to include pill patch ring Depo-Provera Implanon and Mirena are all hormonal. The question is with the Mirena how much progesterone she would absorb and will this effect seizure activity. The need for contraception regardless discussed and with ablation she would need to consider contraception such as vasectomy or possibly tubal sterilization with ablation at the same time. Essure with ablation separate procedure also discussed. Ultimately hysterectomy as definitive treatment reviewed.  After lengthy discussion the patient wants to proceed with hysterectomy. She wants to keep her remaining ovary for continued hormone production given the inability to  tolerate external hormones from a seizure activity standpoint.    Past medical history,surgical history, medications, allergies, family history and social history were all reviewed and documented in the EPIC chart. ROS:  Was performed and pertinent positives and negatives are included in the history of present illness.  Exam:  [name] assistant General: well developed, well nourished female, no acute distress HEENT: normal  Lungs: clear to auscultation without wheezing, rales or rhonchi  Cardiac: regular rate without rubs, murmurs or gallops  Abdomen: soft, nontender without masses, guarding, rebound, organomegaly  Pelvic: external bus vagina: normal   Cervix: grossly normal  Uterus: normal size, midline and mobile, nontender  Adnexa: without masses or tenderness      Assessment/Plan:  42 y.o. G0P0 with accelerating menorrhagia and intermenstrual bleeding over the past 6 months to approaching one year. Normal hormone studies as well as sonohysterogram and endometrial biopsy. Seizure activity limits options for treatment to avoid hormonal-containing products. After lengthy discussion the patient elects to proceed with hysterectomy. Given her past history of endometriosis proceed with laparoscopic assistance. We'll tentatively plan on LAVH possible TLH. The patient and her husband understand though that I may convert to an abdominal hysterectomy with a larger incision and longer recovery if he feels unsafe to proceed with the laparoscopic approach or complications arise. Absolute irreversible sterility associated with hysterectomy was reviewed given her nulliparous status and they clearly understand and accept this. Sexuality following hysterectomy was also reviewed and the potential for persistent orgasmic dysfunction as well as persistent dyspareunia discussed.  The patient wants to keep her ovary and by doing so she understands the risks of ovarian disease in the future to include both benign and  ovarian cancer. She does give me permission to remove her only remaining ovary if significant disease is  encountered it is my best intraoperative decision to do so and she would accept this. The expected intraoperative postoperative courses as well as the recovery period was reviewed with them. The risk of infection, prolonged antibiotics, abscess or hematoma formation requiring reoperation and drainage was reviewed. Hemorrhage necessitating transfusion and the risk of transfusion discussed to include transfusion reaction hepatitis HIV mad cow disease and other unknown entities.  Incisional complications requiring opening and draining of incisions and closure by secondary intention as well as long-term issues of cosmetics/keloid formation and hernia formation reviewed. The risk of inadvertent injury to internal organs including bowel bladder ureters vessels and nerves either immediately recognized or delay recognized necessitating major exploratory reparative surgeries and future reparative surgeries including ostomy formation by our section bladder repair ureteral damage repair was all discussed understood and accepted. The patient is having some intermittent left pelvic pain despite her left salpingo-oophorectomy. She understands that there are no guarantees as far as pain relief and that her pain may persist worsen or change following the procedure. The patient and her husband's questions were answered to their satisfaction and she is ready to proceed with surgery.     Dara Lords MD, 5:27 PM 02/07/2013

## 2013-02-07 NOTE — Progress Notes (Signed)
[name] [birth year] [id number]   Preoperative consult  Chief complaint: Heavy irregular bleeding, left pelvic pain  History of present illness: 42 y.o. G0P0 patient presents complaining of heavy irregular menses over the past 6 months. Her periods have gotten heavier where she has frequent pad changes passing clots. Also having some bleeding in between her periods. She has a very complex history to include seizure disorder where hormone treatment is contraindicated as it increases seizure activity. Also has a history of endometriosis with initial laparoscopy 2008. She had a follow up laparoscopy at [facility] by Dr. Fredrich Birks where she had a laparoscopic left salpingo-oophorectomy 2009. Recent lab work to include [facility] prolactin TSH were normal and she had a negative sonohysterogram with negative endometrial biopsy.  I reviewed options for management of her heavy menses with her and her husband to include pill patch ring, progesterone only such as Implanon, Depo-Provera, Depo-Lupron, Mirena IUD, endometrial ablation, hysterectomy. Patient cannot tolerate hormonal contraception as it leads to increased seizure activity. She is doing well at her current regimen and is very reluctant to introduce hormones. I reviewed the options for management to include pill patch ring Depo-Provera Implanon and Mirena are all hormonal. The question is with the Mirena how much progesterone she would absorb and will this effect seizure activity. The need for contraception regardless discussed and with ablation she would need to consider contraception such as vasectomy or possibly tubal sterilization with ablation at the same time. Essure with ablation separate procedure also discussed. Ultimately hysterectomy as definitive treatment reviewed.  After lengthy discussion the patient wants to proceed with hysterectomy. She wants to keep her remaining ovary for continued hormone production given the inability to tolerate  external hormones from a seizure activity standpoint.    Past medical history,surgical history, medications, allergies, family history and social history were all reviewed and documented in the EPIC chart. ROS:  Was performed and pertinent positives and negatives are included in the history of present illness.  Exam:  [name] assistant General: well developed, well nourished female, no acute distress HEENT: normal  Lungs: clear to auscultation without wheezing, rales or rhonchi  Cardiac: regular rate without rubs, murmurs or gallops  Abdomen: soft, nontender without masses, guarding, rebound, organomegaly  Pelvic: external bus vagina: normal   Cervix: grossly normal  Uterus: normal size, midline and mobile, nontender  Adnexa: without masses or tenderness      Assessment/Plan:  42 y.o. G0P0 with accelerating menorrhagia and intermenstrual bleeding over the past 6 months to approaching one year. Normal hormone studies as well as sonohysterogram and endometrial biopsy. Seizure activity limits options for treatment to avoid hormonal-containing products. After lengthy discussion the patient elects to proceed with hysterectomy. Given her past history of endometriosis proceed with laparoscopic assistance. We'll tentatively plan on LAVH possible TLH. The patient and her husband understand though that I may convert to an abdominal hysterectomy with a larger incision and longer recovery if he feels unsafe to proceed with the laparoscopic approach or complications arise. Absolute irreversible sterility associated with hysterectomy was reviewed given her nulliparous status and they clearly understand and accept this. Sexuality following hysterectomy was also reviewed and the potential for persistent orgasmic dysfunction as well as persistent dyspareunia discussed.  The patient wants to keep her ovary and by doing so she understands the risks of ovarian disease in the future to include both benign and ovarian  cancer. She does give me permission to remove her only remaining ovary if significant disease is encountered  it is my best intraoperative decision to do so and she would accept this. The expected intraoperative postoperative courses as well as the recovery period was reviewed with them. The risk of infection, prolonged antibiotics, abscess or hematoma formation requiring reoperation and drainage was reviewed. Hemorrhage necessitating transfusion and the risk of transfusion discussed to include transfusion reaction hepatitis HIV mad cow disease and other unknown entities.  Incisional complications requiring opening and draining of incisions and closure by secondary intention as well as long-term issues of cosmetics/keloid formation and hernia formation reviewed. The risk of inadvertent injury to internal organs including bowel bladder ureters vessels and nerves either immediately recognized or delay recognized necessitating major exploratory reparative surgeries and future reparative surgeries including ostomy formation by our section bladder repair ureteral damage repair was all discussed understood and accepted. The patient is having some intermittent left pelvic pain despite her left salpingo-oophorectomy. She understands that there are no guarantees as far as pain relief and that her pain may persist worsen or change following the procedure. The patient and her husband's questions were answered to their satisfaction and she is ready to proceed with surgery.    Dara Lords MD, 5:13 PM 02/07/2013

## 2013-02-07 NOTE — Patient Instructions (Signed)
Followup for surgery as scheduled. 

## 2013-02-09 ENCOUNTER — Encounter (HOSPITAL_COMMUNITY)
Admission: RE | Admit: 2013-02-09 | Discharge: 2013-02-09 | Disposition: A | Discharge status: Home / Self Care | Payer: Medicare Other | Source: Ambulatory Visit | Attending: Gynecology | Admitting: Gynecology

## 2013-02-09 ENCOUNTER — Inpatient Hospital Stay (HOSPITAL_COMMUNITY): Admission: RE | Admit: 2013-02-09 | Payer: Medicare Other | Source: Ambulatory Visit

## 2013-02-09 ENCOUNTER — Encounter (HOSPITAL_COMMUNITY): Payer: Self-pay

## 2013-02-09 HISTORY — DX: Headache: R51

## 2013-02-09 HISTORY — DX: Other amnesia: R41.3

## 2013-02-09 LAB — CBC
HCT: 44.9 % (ref 36.0–46.0)
Hemoglobin: 15.3 g/dL — ABNORMAL HIGH (ref 12.0–15.0)
MCH: 30.8 pg (ref 26.0–34.0)
MCHC: 34.1 g/dL (ref 30.0–36.0)
MCV: 90.3 fL (ref 78.0–100.0)
Platelets: 240 10*3/uL (ref 150–400)
RBC: 4.97 MIL/uL (ref 3.87–5.11)
RDW: 13.4 % (ref 11.5–15.5)
WBC: 12.6 10*3/uL — ABNORMAL HIGH (ref 4.0–10.5)

## 2013-02-09 LAB — COMPREHENSIVE METABOLIC PANEL
ALT: 11 U/L (ref 0–35)
AST: 15 U/L (ref 0–37)
Albumin: 4.2 g/dL (ref 3.5–5.2)
Alkaline Phosphatase: 52 U/L (ref 39–117)
BUN: 16 mg/dL (ref 6–23)
CO2: 26 mEq/L (ref 19–32)
Calcium: 9.5 mg/dL (ref 8.4–10.5)
Chloride: 99 mEq/L (ref 96–112)
Creatinine, Ser: 0.87 mg/dL (ref 0.50–1.10)
GFR calc Af Amer: >90 mL/min (ref >90)
GFR calc non Af Amer: 81 mL/min — ABNORMAL LOW (ref >90)
Glucose, Bld: 107 mg/dL — ABNORMAL HIGH (ref 70–99)
Potassium: 3.6 mEq/L (ref 3.5–5.1)
Sodium: 135 mEq/L (ref 135–145)
Total Bilirubin: 0.4 mg/dL (ref 0.3–1.2)
Total Protein: 7.4 g/dL (ref 6.0–8.3)

## 2013-02-09 LAB — SURGICAL PCR SCREEN
MRSA, PCR: NEGATIVE
Staphylococcus aureus: NEGATIVE

## 2013-02-09 NOTE — Patient Instructions (Addendum)
Your procedure is scheduled on:02/13/13  Enter through the Main Entrance at :6am Pick up desk phone and dial [phone] and inform us of your arrival.  Please call [phone] if you have any problems the morning of surgery.  Remember: Do not eat or drink after midnight:Sunday   Take these meds the morning of surgery with a sip of water:take all usual morning meds but do not take vitamins on day of surgery  DO NOT wear jewelry, eye make-up, lipstick,body lotion, or dark fingernail polish. Do not shave for 48 hours prior to surgery.  If you are to be admitted after surgery, leave suitcase in car until your room has been assigned. Patients discharged on the day of surgery will not be allowed to drive home.  Your procedure is scheduled on:  Enter through the Main Entrance at : Pick up desk phone and dial [phone] and inform us of your arrival.  Please call [phone] if you have any problems the morning of surgery.  Remember: Do not eat or drink after midnight:   Take these meds the morning of surgery with a sip of water:  DO NOT wear jewelry, eye make-up, lipstick,body lotion, or dark fingernail polish. Do not shave for 48 hours prior to surgery.  If you are to be admitted after surgery, leave suitcase in car until your room has been assigned. Patients discharged on the day of surgery will not be allowed to drive home.

## 2013-02-10 ENCOUNTER — Inpatient Hospital Stay (HOSPITAL_COMMUNITY): Admission: RE | Admit: 2013-02-10 | Payer: Medicare Other | Source: Ambulatory Visit

## 2013-02-12 MED ORDER — DEXTROSE 5 % IV SOLN
2.0000 g | INTRAVENOUS | Status: AC
  Administered 2013-02-13: 2 g via INTRAVENOUS
  Filled 2013-02-12: qty 2

## 2013-02-13 ENCOUNTER — Ambulatory Visit (HOSPITAL_COMMUNITY): Payer: Medicare Other | Admitting: Anesthesiology

## 2013-02-13 ENCOUNTER — Ambulatory Visit (HOSPITAL_COMMUNITY)
Admission: RE | Admit: 2013-02-13 | Discharge: 2013-02-14 | Disposition: A | Discharge status: Home / Self Care | Payer: Medicare Other | Source: Ambulatory Visit | Attending: Gynecology | Admitting: Gynecology

## 2013-02-13 ENCOUNTER — Encounter (HOSPITAL_COMMUNITY): Payer: Self-pay | Admitting: Anesthesiology

## 2013-02-13 ENCOUNTER — Encounter (HOSPITAL_COMMUNITY)
Admission: RE | Disposition: A | Discharge status: Home / Self Care | Payer: Self-pay | Source: Ambulatory Visit | Attending: Gynecology

## 2013-02-13 DIAGNOSIS — N949 Unspecified condition associated with female genital organs and menstrual cycle: Secondary | ICD-10-CM | POA: Insufficient documentation

## 2013-02-13 DIAGNOSIS — N920 Excessive and frequent menstruation with regular cycle: Secondary | ICD-10-CM

## 2013-02-13 DIAGNOSIS — D251 Intramural leiomyoma of uterus: Secondary | ICD-10-CM | POA: Insufficient documentation

## 2013-02-13 DIAGNOSIS — N803 Endometriosis of pelvic peritoneum, unspecified: Secondary | ICD-10-CM | POA: Insufficient documentation

## 2013-02-13 DIAGNOSIS — N800 Endometriosis of uterus: Secondary | ICD-10-CM

## 2013-02-13 HISTORY — PX: ABLATION ON ENDOMETRIOSIS: SHX5787

## 2013-02-13 HISTORY — PX: LAPAROSCOPIC ASSISTED VAGINAL HYSTERECTOMY: SHX5398

## 2013-02-13 LAB — HCG, SERUM, QUALITATIVE: Preg, Serum: NEGATIVE

## 2013-02-13 SURGERY — HYSTERECTOMY, VAGINAL, LAPAROSCOPY-ASSISTED
Anesthesia: General | Wound class: Clean Contaminated

## 2013-02-13 MED ORDER — KETOROLAC TROMETHAMINE 30 MG/ML IJ SOLN
INTRAMUSCULAR | Status: AC
  Administered 2013-02-13: 30 mg via INTRAVENOUS
  Filled 2013-02-13: qty 1

## 2013-02-13 MED ORDER — LEVOTHYROXINE SODIUM 125 MCG PO TABS
125.0000 ug | ORAL_TABLET | Freq: Every day | ORAL | Status: DC
  Administered 2013-02-13 – 2013-02-14 (×2): 125 ug via ORAL
  Filled 2013-02-13 (×3): qty 1

## 2013-02-13 MED ORDER — OXCARBAZEPINE 300 MG PO TABS
300.0000 mg | ORAL_TABLET | Freq: Every day | ORAL | Status: DC
  Filled 2013-02-13 (×2): qty 1

## 2013-02-13 MED ORDER — DEXTROSE-NACL 5-0.9 % IV SOLN: INTRAVENOUS | Status: DC

## 2013-02-13 MED ORDER — KETOROLAC TROMETHAMINE 30 MG/ML IJ SOLN
INTRAMUSCULAR | Status: AC
  Filled 2013-02-13: qty 1

## 2013-02-13 MED ORDER — MORPHINE SULFATE 4 MG/ML IJ SOLN
1.0000 mg | INTRAMUSCULAR | Status: DC | PRN
  Administered 2013-02-13 (×2): 1 mg via INTRAVENOUS
  Filled 2013-02-13: qty 1

## 2013-02-13 MED ORDER — LIDOCAINE HCL (CARDIAC) 20 MG/ML IV SOLN
INTRAVENOUS | Status: DC | PRN
  Administered 2013-02-13: 80 mg via INTRAVENOUS

## 2013-02-13 MED ORDER — ONDANSETRON HCL 4 MG/2ML IJ SOLN: 4.0000 mg | Freq: Four times a day (QID) | INTRAMUSCULAR | Status: DC | PRN

## 2013-02-13 MED ORDER — KETOROLAC TROMETHAMINE 30 MG/ML IJ SOLN
30.0000 mg | Freq: Four times a day (QID) | INTRAMUSCULAR | Status: DC
  Administered 2013-02-13 – 2013-02-14 (×3): 30 mg via INTRAVENOUS
  Filled 2013-02-13 (×3): qty 1

## 2013-02-13 MED ORDER — ONDANSETRON HCL 4 MG PO TABS: 4.0000 mg | ORAL_TABLET | Freq: Four times a day (QID) | ORAL | Status: DC | PRN

## 2013-02-13 MED ORDER — LEVETIRACETAM 500 MG PO TABS: 1000.0000 mg | ORAL_TABLET | Freq: Two times a day (BID) | ORAL | Status: DC

## 2013-02-13 MED ORDER — GLYCOPYRROLATE 0.2 MG/ML IJ SOLN
INTRAMUSCULAR | Status: DC | PRN
  Administered 2013-02-13: 0.4 mg via INTRAVENOUS

## 2013-02-13 MED ORDER — LIDOCAINE-EPINEPHRINE (PF) 1 %-1:200000 IJ SOLN
INTRAMUSCULAR | Status: DC | PRN
  Administered 2013-02-13: 8 mL

## 2013-02-13 MED ORDER — LIDOCAINE-EPINEPHRINE (PF) 1 %-1:200000 IJ SOLN
INTRAMUSCULAR | Status: AC
  Filled 2013-02-13: qty 10

## 2013-02-13 MED ORDER — LIDOCAINE HCL (CARDIAC) 20 MG/ML IV SOLN
INTRAVENOUS | Status: AC
  Filled 2013-02-13: qty 5

## 2013-02-13 MED ORDER — ROCURONIUM BROMIDE 50 MG/5ML IV SOLN
INTRAVENOUS | Status: AC
  Filled 2013-02-13: qty 1

## 2013-02-13 MED ORDER — BUPIVACAINE HCL (PF) 0.25 % IJ SOLN
INTRAMUSCULAR | Status: DC | PRN
  Administered 2013-02-13: 3 mL

## 2013-02-13 MED ORDER — ROCURONIUM BROMIDE 100 MG/10ML IV SOLN
INTRAVENOUS | Status: DC | PRN
  Administered 2013-02-13: 30 mg via INTRAVENOUS
  Administered 2013-02-13: 5 mg via INTRAVENOUS
  Administered 2013-02-13: 10 mg via INTRAVENOUS

## 2013-02-13 MED ORDER — OXCARBAZEPINE 300 MG PO TABS
600.0000 mg | ORAL_TABLET | Freq: Every day | ORAL | Status: DC
  Administered 2013-02-13: 600 mg via ORAL
  Filled 2013-02-13: qty 2

## 2013-02-13 MED ORDER — LEVETIRACETAM ER 500 MG PO TB24
500.0000 mg | ORAL_TABLET | Freq: Two times a day (BID) | ORAL | Status: DC
  Administered 2013-02-13: 500 mg via ORAL
  Filled 2013-02-13 (×3): qty 1

## 2013-02-13 MED ORDER — ACETAMINOPHEN 10 MG/ML IV SOLN: 1000.0000 mg | Freq: Once | INTRAVENOUS | Status: DC

## 2013-02-13 MED ORDER — PROPOFOL 10 MG/ML IV EMUL
INTRAVENOUS | Status: AC
  Filled 2013-02-13: qty 20

## 2013-02-13 MED ORDER — DIPHENHYDRAMINE HCL 25 MG PO CAPS: 50.0000 mg | ORAL_CAPSULE | Freq: Four times a day (QID) | ORAL | Status: DC | PRN

## 2013-02-13 MED ORDER — ONDANSETRON HCL 4 MG/2ML IJ SOLN
INTRAMUSCULAR | Status: AC
  Filled 2013-02-13: qty 2

## 2013-02-13 MED ORDER — HYDROMORPHONE HCL PF 1 MG/ML IJ SOLN
INTRAMUSCULAR | Status: AC
  Filled 2013-02-13: qty 1

## 2013-02-13 MED ORDER — ACETAMINOPHEN 10 MG/ML IV SOLN: INTRAVENOUS | Status: DC | PRN

## 2013-02-13 MED ORDER — VITAMIN D3 25 MCG (1000 UNIT) PO TABS
1000.0000 [IU] | ORAL_TABLET | Freq: Every day | ORAL | Status: DC
  Filled 2013-02-13 (×3): qty 1

## 2013-02-13 MED ORDER — DEXTROSE-NACL 5-0.9 % IV SOLN
INTRAVENOUS | Status: DC
  Administered 2013-02-13 (×2): via INTRAVENOUS

## 2013-02-13 MED ORDER — ACETAZOLAMIDE 250 MG PO TABS
125.0000 mg | ORAL_TABLET | Freq: Every day | ORAL | Status: DC
  Administered 2013-02-14: 09:00:00 via ORAL
  Filled 2013-02-13 (×3): qty 1

## 2013-02-13 MED ORDER — LAMOTRIGINE 150 MG PO TABS
300.0000 mg | ORAL_TABLET | Freq: Every day | ORAL | Status: DC
  Administered 2013-02-13: 300 mg via ORAL
  Filled 2013-02-13: qty 2

## 2013-02-13 MED ORDER — NEOSTIGMINE METHYLSULFATE 1 MG/ML IJ SOLN
INTRAMUSCULAR | Status: DC | PRN
  Administered 2013-02-13: 3 mg via INTRAVENOUS

## 2013-02-13 MED ORDER — KETOROLAC TROMETHAMINE 30 MG/ML IJ SOLN: 15.0000 mg | Freq: Once | INTRAMUSCULAR | Status: AC | PRN

## 2013-02-13 MED ORDER — NEOSTIGMINE METHYLSULFATE 1 MG/ML IJ SOLN
INTRAMUSCULAR | Status: AC
  Filled 2013-02-13: qty 1

## 2013-02-13 MED ORDER — PROPOFOL 10 MG/ML IV EMUL
INTRAVENOUS | Status: DC | PRN
  Administered 2013-02-13: 50 mg via INTRAVENOUS
  Administered 2013-02-13: 160 mg via INTRAVENOUS

## 2013-02-13 MED ORDER — KETOROLAC TROMETHAMINE 30 MG/ML IJ SOLN
INTRAMUSCULAR | Status: DC | PRN
  Administered 2013-02-13: 30 mg via INTRAVENOUS

## 2013-02-13 MED ORDER — FENTANYL CITRATE 0.05 MG/ML IJ SOLN
INTRAMUSCULAR | Status: DC | PRN
  Administered 2013-02-13 (×2): 100 ug via INTRAVENOUS

## 2013-02-13 MED ORDER — LAMOTRIGINE 200 MG PO TABS
200.0000 mg | ORAL_TABLET | ORAL | Status: DC
  Administered 2013-02-14: 200 mg via ORAL
  Filled 2013-02-13 (×2): qty 1

## 2013-02-13 MED ORDER — HYDROMORPHONE HCL PF 1 MG/ML IJ SOLN
INTRAMUSCULAR | Status: DC | PRN
  Administered 2013-02-13: 1 mg via INTRAVENOUS

## 2013-02-13 MED ORDER — FENTANYL CITRATE 0.05 MG/ML IJ SOLN
INTRAMUSCULAR | Status: AC
  Filled 2013-02-13: qty 5

## 2013-02-13 MED ORDER — GLYCOPYRROLATE 0.2 MG/ML IJ SOLN
INTRAMUSCULAR | Status: AC
  Filled 2013-02-13: qty 1

## 2013-02-13 MED ORDER — ADULT MULTIVITAMIN W/MINERALS CH
1.0000 | ORAL_TABLET | Freq: Every day | ORAL | Status: DC
  Filled 2013-02-13 (×3): qty 1

## 2013-02-13 MED ORDER — HYDROCODONE-ACETAMINOPHEN 5-325 MG PO TABS: 2.0000 | ORAL_TABLET | Freq: Four times a day (QID) | ORAL | Status: DC | PRN

## 2013-02-13 MED ORDER — ACETAMINOPHEN 10 MG/ML IV SOLN
INTRAVENOUS | Status: AC
  Administered 2013-02-13: 1000 mg via INTRAVENOUS
  Filled 2013-02-13: qty 100

## 2013-02-13 MED ORDER — ONDANSETRON HCL 4 MG/2ML IJ SOLN
INTRAMUSCULAR | Status: DC | PRN
  Administered 2013-02-13: 4 mg via INTRAVENOUS

## 2013-02-13 MED ORDER — MIDAZOLAM HCL 5 MG/5ML IJ SOLN
INTRAMUSCULAR | Status: DC | PRN
  Administered 2013-02-13: 2 mg via INTRAVENOUS

## 2013-02-13 MED ORDER — LACTATED RINGERS IR SOLN
Status: DC | PRN
  Administered 2013-02-13: 3000 mL

## 2013-02-13 MED ORDER — MIDAZOLAM HCL 2 MG/2ML IJ SOLN
INTRAMUSCULAR | Status: AC
  Filled 2013-02-13: qty 2

## 2013-02-13 MED ORDER — LACTATED RINGERS IV SOLN
INTRAVENOUS | Status: DC
  Administered 2013-02-13 (×3): via INTRAVENOUS

## 2013-02-13 MED ORDER — BUPIVACAINE HCL (PF) 0.25 % IJ SOLN
INTRAMUSCULAR | Status: AC
  Filled 2013-02-13: qty 30

## 2013-02-13 MED ORDER — FENTANYL CITRATE 0.05 MG/ML IJ SOLN
INTRAMUSCULAR | Status: AC
  Administered 2013-02-13: 50 ug via INTRAVENOUS
  Filled 2013-02-13: qty 2

## 2013-02-13 MED ORDER — FENTANYL CITRATE 0.05 MG/ML IJ SOLN: 25.0000 ug | INTRAMUSCULAR | Status: DC | PRN

## 2013-02-13 MED ORDER — KETOROLAC TROMETHAMINE 30 MG/ML IJ SOLN: 30.0000 mg | Freq: Four times a day (QID) | INTRAMUSCULAR | Status: DC

## 2013-02-13 MED ORDER — 0.9 % SODIUM CHLORIDE (POUR BTL) OPTIME
TOPICAL | Status: DC | PRN
  Administered 2013-02-13: 1000 mL

## 2013-02-13 SURGICAL SUPPLY — 42 items
ADH SKN CLS LQ APL DERMABOND (GAUZE/BANDAGES/DRESSINGS) ×1
CABLE HIGH FREQUENCY MONO STRZ (ELECTRODE) IMPLANT
CLOTH BEACON ORANGE TIMEOUT ST (SAFETY) ×2 IMPLANT
CONT PATH 16OZ SNAP LID 3702 (MISCELLANEOUS) ×2 IMPLANT
COVER MAYO STAND STRL (DRAPES) ×2 IMPLANT
COVER TABLE BACK 60X90 (DRAPES) ×2 IMPLANT
DECANTER SPIKE VIAL GLASS SM (MISCELLANEOUS) IMPLANT
DERMABOND ADHESIVE PROPEN (GAUZE/BANDAGES/DRESSINGS) ×1
DERMABOND ADVANCED .7 DNX6 (GAUZE/BANDAGES/DRESSINGS) IMPLANT
ELECT REM PT RETURN 9FT ADLT (ELECTROSURGICAL)
ELECTRODE REM PT RTRN 9FT ADLT (ELECTROSURGICAL) IMPLANT
GLOVE BIO SURGEON STRL SZ7.5 (GLOVE) ×4 IMPLANT
GOWN STRL REIN XL XLG (GOWN DISPOSABLE) ×8 IMPLANT
HEMOSTAT SURGICEL 4X8 (HEMOSTASIS) ×1 IMPLANT
NDL SPNL 18GX3.5 QUINCKE PK (NEEDLE) ×1 IMPLANT
NEEDLE MAYO .5 CIRCLE (NEEDLE) IMPLANT
NEEDLE SPNL 18GX3.5 QUINCKE PK (NEEDLE) ×2 IMPLANT
NS IRRIG 1000ML POUR BTL (IV SOLUTION) ×2 IMPLANT
PACK LAVH (CUSTOM PROCEDURE TRAY) ×2 IMPLANT
PROTECTOR NERVE ULNAR (MISCELLANEOUS) ×2 IMPLANT
SCALPEL HARMONIC ACE (MISCELLANEOUS) ×1 IMPLANT
SCISSORS LAP 5X35 DISP (ENDOMECHANICALS) ×1 IMPLANT
SET CYSTO W/LG BORE CLAMP LF (SET/KITS/TRAYS/PACK) IMPLANT
SET IRRIG TUBING LAPAROSCOPIC (IRRIGATION / IRRIGATOR) ×1 IMPLANT
SOLUTION ELECTROLUBE (MISCELLANEOUS) IMPLANT
STRIP CLOSURE SKIN 1/4X3 (GAUZE/BANDAGES/DRESSINGS) IMPLANT
SUT PLAIN 4 0 FS 2 27 (SUTURE) ×2 IMPLANT
SUT VIC AB 0 CT1 18XCR BRD8 (SUTURE) ×3 IMPLANT
SUT VIC AB 0 CT1 27 (SUTURE) ×2
SUT VIC AB 0 CT1 27XBRD ANBCTR (SUTURE) ×1 IMPLANT
SUT VIC AB 0 CT1 8-18 (SUTURE) ×6
SUT VIC AB 2-0 SH 27 (SUTURE) ×2
SUT VIC AB 2-0 SH 27XBRD (SUTURE) ×1 IMPLANT
SUT VICRYL 0 TIES 12 18 (SUTURE) ×2 IMPLANT
SUT VICRYL 0 UR6 27IN ABS (SUTURE) ×2 IMPLANT
SYR BULB IRRIGATION 50ML (SYRINGE) ×2 IMPLANT
TOWEL OR 17X24 6PK STRL BLUE (TOWEL DISPOSABLE) ×4 IMPLANT
TRAY FOLEY CATH 14FR (SET/KITS/TRAYS/PACK) ×2 IMPLANT
TROCAR XCEL NON-BLD 11X100MML (ENDOMECHANICALS) ×2 IMPLANT
TROCAR XCEL NON-BLD 5MMX100MML (ENDOMECHANICALS) IMPLANT
TROCAR XCEL OPT SLVE 5M 100M (ENDOMECHANICALS) IMPLANT
WATER STERILE IRR 1000ML POUR (IV SOLUTION) ×2 IMPLANT

## 2013-02-13 NOTE — Op Note (Signed)
[name] 1971-05-12 [id number]   Post Operative Note   Date of surgery:  02/13/2013  Pre Op Dx:  Menorrhagia, pelvic pain  Post Op Dx:  Menorrhagia, endometriosis  Procedure:  Laparoscopic-assisted vaginal hysterectomy, excision and fulguration of endometriosis  Surgeon:  Dara Lords  Assistant:  Reynaldo Minium  Anesthesia:  General  EBL:  200 cc per anesthesia  Complications:  None  Specimen:  #1 peritoneal biopsy #2 uterus to pathology  Findings: EUA:  External BUS vagina normal. Cervix normal. Uterus normal size midline and mobile. Adnexa without masses   Operative:  Anterior cul-de-sac normal. Posterior cul-de-sac with classic powder burn endometriotic implants mid cul-de-sac and left uterosacral ligament. Uterus normal size shape and contour. Small classic powder burn endometriotic implant insertion of the uterosacral ligament and uterus. Left ovary and fallopian tube surgically absent. Classic powder burn endometriotic implant upper pelvic brim slightly inferior to the ureter. Right fallopian tube and ovary grossly normal.  Upper abdominal exam shows liver smooth no abnormalities. Gallbladder grossly normal. Appendix not visualized. No gross upper abdominal pathology noted.  Procedure:  The patient was taken to the operating room, underwent general anesthesia, was placed in the low dorsal lithotomy position, received an abdominal, perineal, vaginal preparation with Betadine solution, EUA performed and an indwelling Foley catheter was placed. The timeout was performed by the surgical team. A Hulka tenaculum was placed on the cervix and the patient was draped in the usual fashion. A vertical infraumbilical incision was made and using the direct entry 10 mm laparoscopic trocar the abdomen was directly entered under direct visualization without difficulty and subsequently insufflated. Right and left 5 mm suprapubic ports were then placed under tract visualization  after transillumination for the vessels without difficulty. Examination of the pelvic organs and upper abdominal exam was carried out with findings noted above. The right uterine ovarian ligament and vessels were transected using the harmonic scalpel as was the fallopian tube at the insertion to the uterus and the broad ligament was incised to the level of the round ligament which likewise was transected with the harmonic scalpel. The anterior vesicouterine peritoneal fold was incised to the midline. The left round ligament was then incised with the harmonic scalpel and the anterior vesicouterine peritoneal fold was incised to meet the other side and the bladder flap was sharply and bluntly developed without difficulty. The right and left uterine vessels were identified and bipolar cauterized at the junction of the cervix to lower uterine segment. Attention was then turned to the vaginal portion of the case and the patient was placed in the high lithotomy position, the cervix visualized with a weighted speculum, grasped with a tenaculum and the cervical mucosa was circumferentially injected using 1% lidocaine/1-200,000 epinephrine mixture , a total of 9 cc. The cervical mucosa was then sharply incised circumferentially and the paracervical planes were sharply developed. The anterior vesicouterine plane was sharply and bluntly developed and ultimately the anterior cul-de-sac was entered without difficulty. The posterior cul-de-sac was then sharply entered without difficulty and a long weighted speculum was placed. The right and left utero sacral ligaments were then clamped cut and ligated using 0 Vicryl suture and tagged for future reference. The uterus was then freed from its attachments to clamping cutting and ligating using 0 Vicryl suture of the cardinal ligaments and parametrial tissues and ultimately the uterus was delivered through the vagina and sent to pathology. The long weighted speculum was replaced with  a shorter weighted speculum, the intestines packed with  a tagged tail sponge and the posterior vaginal cuff was run from uterosacral ligament to uterosacral ligament using 0 Vicryl suture in a running interlocking stitch. The bowel packing was removed and the vagina was then closed anterior to posterior using 0 Vicryl suture in interrupted figure-of-eight stitch. The vagina was irrigated showing adequate hemostasis and attention was redirected to the laparoscopic portion of the procedure after regloving of the surgical team and the patient was placed in the low dorsal lithotomy position. The abdomen was reinsufflated and copiously irrigated and several small bleeding points were addressed using bipolar cautery at the vaginal cuff. The posterior cul-de-sac and left uterosacral ligament endometriotic implants were superficially bipolar cauterized after identification and tracing of the ureter on the left. And subsequently the area of endometriosis slightly inferior to the course of the ureter at the pelvic brim was identified elevated away from the ureter and the peritoneum was incised sharply and the implant excised. The ureter was reinspected and found to be away from the surgical site with normal peristalsis.  The pelvis again was irrigated and the gas was slowly allowed to escape reinspected the pelvis under low pressure situation showing adequate hemostasis at the surgical sites. Surgicel was placed at the vaginal cuff to assure continued hemostasis.  The suprapubic ports were then removed showing adequate hemostasis and the infraumbilical port was backed out under direct visualization showing hemostasis and no evidence of hernia formation. All skin incisions were injected using 0.25% Marcaine and the infraumbilical port site was closed using 0 Vicryl suture in an interrupted subcutaneous fascial stitch. All skin incisions were closed using Dermabond skin adhesive. The patient received intraoperative Toradol, was  awakened without difficulty after being placed in the supine position and was taken to the recovery room in good condition having tolerated the procedure well with free-flowing clear yellow urine in the catheter.   Dara Lords MD, 9:45 AM 02/13/2013

## 2013-02-13 NOTE — Transfer of Care (Signed)
Immediate Anesthesia Transfer of Care Note  Patient: [name]  Procedure(s) Performed: Procedure(s) with comments: LAPAROSCOPIC ASSISTED VAGINAL HYSTERECTOMY (N/A) Incision andABLATION ON ENDOMETRIOSIS (N/A) - Laparoscopic Ablation of Endometriosis with Peritoneal Biopsy  Patient Location: PACU  Anesthesia Type:General  Level of Consciousness: awake, alert  and oriented  Airway & Oxygen Therapy: Patient Spontanous Breathing and Patient connected to nasal cannula oxygen  Post-op Assessment: Report given to PACU RN and Post -op Vital signs reviewed and stable  Post vital signs: stable  Complications: No apparent anesthesia complications

## 2013-02-13 NOTE — Anesthesia Preprocedure Evaluation (Signed)
Anesthesia Evaluation  Patient identified by MRN, date of birth, ID band Patient awake    Reviewed: Allergy & Precautions, H&P , NPO status , Patient's Chart, lab work & pertinent test results  Airway Mallampati: I TM Distance: >3 FB Neck ROM: full    Dental no notable dental hx. (+) Teeth Intact   Pulmonary neg pulmonary ROS,    Pulmonary exam normal       Cardiovascular negative cardio ROS      Neuro/Psych  Headaches, Seizures -, Well Controlled,  negative psych ROS   GI/Hepatic negative GI ROS, Neg liver ROS,   Endo/Other  Hypothyroidism   Renal/GU negative Renal ROS  negative genitourinary   Musculoskeletal negative musculoskeletal ROS (+)   Abdominal Normal abdominal exam  (+)   Peds negative pediatric ROS (+)  Hematology negative hematology ROS (+)   Anesthesia Other Findings   Reproductive/Obstetrics negative OB ROS                           Anesthesia Physical Anesthesia Plan  ASA: II  Anesthesia Plan: General   Post-op Pain Management:    Induction: Intravenous  Airway Management Planned: Oral ETT  Additional Equipment:   Intra-op Plan:   Post-operative Plan: Extubation in OR  Informed Consent: I have reviewed the patients History and Physical, chart, labs and discussed the procedure including the risks, benefits and alternatives for the proposed anesthesia with the patient or authorized representative who has indicated his/her understanding and acceptance.   Dental Advisory Given  Plan Discussed with: CRNA and Surgeon  Anesthesia Plan Comments:         Anesthesia Quick Evaluation

## 2013-02-13 NOTE — Anesthesia Postprocedure Evaluation (Signed)
  Anesthesia Post-op Note  Anesthesia Post Note  Patient: [name]  Procedure(s) Performed: Procedure(s) (LRB): LAPAROSCOPIC ASSISTED VAGINAL HYSTERECTOMY (N/A) Incision andABLATION ON ENDOMETRIOSIS (N/A)  Anesthesia type: General  Patient location: PACU  Post pain: Pain level controlled  Post assessment: Post-op Vital signs reviewed  Last Vitals:  Filed Vitals:   02/13/13 1015  BP: 107/66  Pulse: 69  Temp: 36.5 C  Resp: 14    Post vital signs: Reviewed  Level of consciousness: sedated  Complications: No apparent anesthesia complications

## 2013-02-13 NOTE — Progress Notes (Signed)
HX Closed head injury affecting STMemory, Seizures.controlled with meds

## 2013-02-13 NOTE — H&P (Signed)
  The patient was examined.  I reviewed the proposed surgery and consent form with the patient.  The dictated history and physical is current and accurate and all questions were answered. The patient is ready to proceed with surgery and has a realistic understanding and expectation for the outcome.   Dara Lords MD, 7:10 AM 02/13/2013

## 2013-02-13 NOTE — Progress Notes (Signed)
[name] 1971/04/18 [id number]   Day of Surgery s/p Procedure(s): LAPAROSCOPIC ASSISTED VAGINAL HYSTERECTOMY Incision andABLATION ON ENDOMETRIOSIS  Subjective: Patient reports feels well, no acute distress, pain severity reported mild, yes taking PO, foley catheter in place, novoiding, yes ambulating, nopassing flatus  Objective: Afeb, VSS   EXAM General: awake, alert and no distress Resp: rhonchi clear to auscultation bilaterally Cardio: regular rate and rhythm, S1, S2 normal, no murmur, click, rub or gallop GI: soft, minimal tender, bowel sounds present, incisions dry intact Lower Extremities: Without swelling or tenderness Vaginal Bleeding: Reported absent  Assessment: s/p Procedure(s): LAPAROSCOPIC ASSISTED VAGINAL HYSTERECTOMY Incision andABLATION ON ENDOMETRIOSIS: Doing well.  Results of surgery reviewed with patient.  Plan: Continue routine post operative care, advance as tolerated with tentative discharge in am.  LOS: 0 days    Dara Lords, MD 02/13/2013 4:37 PM

## 2013-02-14 ENCOUNTER — Encounter (HOSPITAL_COMMUNITY): Payer: Self-pay | Admitting: Gynecology

## 2013-02-14 LAB — CBC
HCT: 35.6 % — ABNORMAL LOW (ref 36.0–46.0)
Hemoglobin: 11.6 g/dL — ABNORMAL LOW (ref 12.0–15.0)
MCH: 30.1 pg (ref 26.0–34.0)
MCHC: 32.6 g/dL (ref 30.0–36.0)
MCV: 92.2 fL (ref 78.0–100.0)
Platelets: 201 10*3/uL (ref 150–400)
RBC: 3.86 MIL/uL — ABNORMAL LOW (ref 3.87–5.11)
RDW: 13.5 % (ref 11.5–15.5)
WBC: 7.1 10*3/uL (ref 4.0–10.5)

## 2013-02-14 MED ORDER — IBUPROFEN 800 MG PO TABS: 800.0000 mg | ORAL_TABLET | Freq: Three times a day (TID) | ORAL | Status: DC | PRN

## 2013-02-14 NOTE — Progress Notes (Signed)
[name] Feb 24, 1971 [id number]   1 Day Post-Op Procedure(s) (LRB): LAPAROSCOPIC ASSISTED VAGINAL HYSTERECTOMY (N/A) Incision andABLATION ON ENDOMETRIOSIS (N/A)  Subjective: Patient reports feels well, no acute distress, pain severity reported mild, yes taking PO, foley catheter out, yes voiding, yes ambulating, yes passing flatus  Objective: Vital signs in last 24 hours: Temp:  [97.7 F (36.5 C)-98.4 F (36.9 C)] 97.8 F (36.6 C) (03/18 0522) Pulse Rate:  [58-97] 77 (03/18 0522) Resp:  [3-16] 16 (03/18 0522) BP: (87-121)/(57-82) 96/59 mmHg (03/18 0522) SpO2:  [98 %-100 %] 98 % (03/18 0522) Weight:  [148 lb (67.132 kg)] 148 lb (67.132 kg) (03/17 1138) Last BM Date: 02/12/13    EXAM General: awake, alert and no distress Resp: rhonchi clear to auscultation bilaterally Cardio: regular rate and rhythm, S1, S2 normal, no murmur, click, rub or gallop GI: soft, non tender, bowel sounds active, incisions dry intact Lower Extremities: Without swelling or tenderness Vaginal Bleeding: Reported scant   Lab Results:   Recent Labs  02/14/13 0530  WBC 7.1  HGB 11.6*  HCT 35.6*  PLT 201    Assessment: s/p Procedure(s): LAPAROSCOPIC ASSISTED VAGINAL HYSTERECTOMY Incision andABLATION ON ENDOMETRIOSIS: progressing well, ready for discharge.  Ambulating. Eating, voiding with minimal pain.  PO Hb 11.6  Plan: Discharge home today.  Precautions, instructions and follow up were discussed with the patient and due to memory issues written instructions will be provided.  Minimal pain and patient does not want narcotics but prefers ibuprofen 800  mg.  She will call if this is not strong enough.  Patient to call the office to arrange a post-operative appointmant in 2 weeks.  Dara Lords, MD 02/14/2013 8:36 AM

## 2013-02-14 NOTE — Discharge Summary (Signed)
  SATCHA STORLIE 1971-11-15 [id number]   Discharge Summary  Date of Admission:  02/13/2013  Date of Discharge:  02/14/2013  Discharge Diagnosis:  Menorrhagia, leiomyoma, endometriosis  Procedure:  Procedure(s): LAPAROSCOPIC ASSISTED VAGINAL HYSTERECTOMY Excision and ABLATION Of ENDOMETRIOSIS  Pathology: MRN # : [id number] Diagnosis 1. Uterus and cervix - PROLIFERATIVE PHASE ENDOMETRIUM WITH UNDERLYING LEIOMYOMA MEASURING 1 CM IN GREATEST DIMENSION. - BENIGN CERVIX WITH NO SIGNIFICANT PATHOLOGIC ABNORMALITY. - NO ATYPIA, HYPERPLASIA, DYSPLASIA, OR MALIGNANCY PRESENT WITHIN THE SPECIMEN. 2. Peritoneum, biopsy, left pelvic side wall - FINDINGS CONSISTENT WITH ENDOMETRIOSIS. - NO ATYPIA OR MALIGNANCY IDENTIFIED.  Hospital Course:  Patient underwent an uncomplicated laparoscopic assisted vaginal hysterectomy, excision and fulguration of endometriosis 02/13/2013. The patient's postoperative course was uncomplicated and she was discharged on postoperative day #1 ambulating well, tolerating a regular diet, voiding without difficulty with minimal pain. Postoperative hemoglobin 11.6. The patient received instructions for postoperative care and call precautions.  She received a prescription for ibuprofen 800 mg #30 refill x1 for pain at her request for nothing stronger and will be seen in the office 2 weeks following discharge.       Dara Lords MD, 3:51 PM 02/14/2013

## 2013-02-14 NOTE — Anesthesia Postprocedure Evaluation (Signed)
  Anesthesia Post-op Note  Patient: [name]  Procedure(s) Performed: Procedure(s) with comments: LAPAROSCOPIC ASSISTED VAGINAL HYSTERECTOMY (N/A) Incision andABLATION ON ENDOMETRIOSIS (N/A) - Laparoscopic Ablation of Endometriosis with Peritoneal Biopsy  Patient Location: [facility]  Anesthesia Type:General  Level of Consciousness: awake  Airway and Oxygen Therapy: Patient Spontanous Breathing  Post-op Pain: none  Post-op Assessment: Patient's Cardiovascular Status Stable, Respiratory Function Stable, Patent Airway, No signs of Nausea or vomiting, Adequate PO intake and Pain level controlled  Post-op Vital Signs: Reviewed and stable  Complications: No apparent anesthesia complications

## 2013-02-14 NOTE — Progress Notes (Signed)
Pt  Out in wheelchair   Teaching complete  

## 2013-02-15 ENCOUNTER — Telehealth: Payer: Self-pay | Admitting: *Deleted

## 2013-02-15 NOTE — Telephone Encounter (Signed)
Message copied by Aura Camps on Wed Feb 15, 2013 10:28 AM ------      Message from: [name]      Created: Tue Feb 14, 2013  4:26 PM       Tell patient her pathology was benign. It showed endometriosis but no other abnormalities. ------

## 2013-02-15 NOTE — Telephone Encounter (Signed)
Pt informed with the below note. 

## 2013-02-16 ENCOUNTER — Telehealth: Payer: Self-pay | Admitting: *Deleted

## 2013-02-16 NOTE — Telephone Encounter (Signed)
Pt called c/o some constipation after having surgery on 02/13/13 for hysterectomy. Pt will try stool softener  and drink plenty of fluids. She will follow up as needed.

## 2013-02-20 ENCOUNTER — Encounter: Payer: Self-pay | Admitting: Gynecology

## 2013-02-20 ENCOUNTER — Telehealth: Payer: Self-pay

## 2013-02-20 ENCOUNTER — Ambulatory Visit (INDEPENDENT_AMBULATORY_CARE_PROVIDER_SITE_OTHER): Payer: Medicare Other | Admitting: Gynecology

## 2013-02-20 VITALS — Temp 98.5°F

## 2013-02-20 DIAGNOSIS — R102 Pelvic and perineal pain: Secondary | ICD-10-CM

## 2013-02-20 DIAGNOSIS — N949 Unspecified condition associated with female genital organs and menstrual cycle: Secondary | ICD-10-CM

## 2013-02-20 LAB — CBC WITH DIFFERENTIAL/PLATELET
Basophils Absolute: 0 10*3/uL (ref 0.0–0.1)
Basophils Relative: 1 % (ref 0–1)
Eosinophils Absolute: 0.1 10*3/uL (ref 0.0–0.7)
Eosinophils Relative: 2 % (ref 0–5)
HCT: 38.7 % (ref 36.0–46.0)
Hemoglobin: 13 g/dL (ref 12.0–15.0)
Lymphocytes Relative: 21 % (ref 12–46)
Lymphs Abs: 1.4 10*3/uL (ref 0.7–4.0)
MCH: 29.5 pg (ref 26.0–34.0)
MCHC: 33.6 g/dL (ref 30.0–36.0)
MCV: 87.8 fL (ref 78.0–100.0)
Monocytes Absolute: 0.5 10*3/uL (ref 0.1–1.0)
Monocytes Relative: 8 % (ref 3–12)
Neutro Abs: 4.5 10*3/uL (ref 1.7–7.7)
Neutrophils Relative %: 68 % (ref 43–77)
Platelets: 296 10*3/uL (ref 150–400)
RBC: 4.41 MIL/uL (ref 3.87–5.11)
RDW: 13.6 % (ref 11.5–15.5)
WBC: 6.6 10*3/uL (ref 4.0–10.5)

## 2013-02-20 NOTE — Telephone Encounter (Signed)
Patient called complaining of sharp, shooting pains at excision site.  She states they are doubling her up.  She questions if this normal.  I told her she should not be having pains that double her up and I recommended office visit. Dr. Velvet Bathe has a 10:50 opening but she will take her time and head on over to the office and understands that we will be working her in.

## 2013-02-20 NOTE — Progress Notes (Signed)
Patient presents one week postop status post LAVH. Had been doing well up until yesterday with the onset of sharp stabbing intermittent right lower quadrant pain. Is fleeting lasting seconds. No history of this previously. Had been doing well as far as ambulating, eating a regular diet, voiding without difficulty, no nausea vomiting diarrhea fever chills low back pain or other symptoms.  Exam with Selena Batten assistant Afebrile Lungs clear Cardiac regular rate no rubs murmurs or gallops Abdomen with active bowel sounds soft nontender without masses guarding rebound organomegaly. Laparoscopic incision sites healing nicely. Pelvic external BUS vagina normal with cuff intact. Bimanual without masses or tenderness. Rectovaginal confirms.  Assessment and plan: 1 day history of fleeting sharp stabbing right lower quadrant pain. Exam is normal. No symptoms to suggest more serious injuries to bowel or urinary system. I suspect some colonic spasm. We'll check baseline urinalysis and CBC. Ibuprofen 800 mg 3 times a day, heating pad, ASAP call precautions reviewed. Assuming symptoms resolve then followup one week at her scheduled postoperative appointment.

## 2013-02-20 NOTE — Patient Instructions (Addendum)
Call if nausea, vomiting, fever chills, increasing pain or any other concerning symptoms. Otherwise followup in one week at your scheduled postoperative appointment.

## 2013-02-21 LAB — URINALYSIS W MICROSCOPIC + REFLEX CULTURE
Bilirubin Urine: NEGATIVE
Casts: NONE SEEN
Crystals: NONE SEEN
Glucose, UA: NEGATIVE mg/dL
Hgb urine dipstick: NEGATIVE
Ketones, ur: NEGATIVE mg/dL
Leukocytes, UA: NEGATIVE
Nitrite: NEGATIVE
Protein, ur: NEGATIVE mg/dL
Specific Gravity, Urine: 1.014 (ref 1.005–1.030)
Urobilinogen, UA: 0.2 mg/dL (ref 0.0–1.0)
pH: 8 (ref 5.0–8.0)

## 2013-02-27 ENCOUNTER — Ambulatory Visit (INDEPENDENT_AMBULATORY_CARE_PROVIDER_SITE_OTHER): Payer: Medicare Other | Admitting: Gynecology

## 2013-02-27 ENCOUNTER — Encounter: Payer: Self-pay | Admitting: Gynecology

## 2013-02-27 DIAGNOSIS — Z09 Encounter for follow-up examination after completed treatment for conditions other than malignant neoplasm: Secondary | ICD-10-CM

## 2013-02-27 NOTE — Progress Notes (Signed)
Postop check status post LAVH 02/13/2013. Was seen with some pain last week but notes that this is all resolved and she is doing well. Eating drinking voiding and having regular bowel movements.  Exam with [name] Asst. Abdomen soft nontender without masses guarding rebound organomegaly. Laparoscopic incisions well-healed. Pelvic external BUS vagina normal with cuff healing nicely suture line intact. Bimanual without masses or tenderness.  Assessment and plan: Normal postop check status post LAVH. Continue with resuming normal activities with the exception of continued pelvic rest. Followup in 2 weeks for next postoperative visit.

## 2013-02-27 NOTE — Patient Instructions (Signed)
Follow up in 2 weeks for follow up exam

## 2013-03-13 ENCOUNTER — Encounter: Payer: Self-pay | Admitting: Gynecology

## 2013-03-13 ENCOUNTER — Ambulatory Visit (INDEPENDENT_AMBULATORY_CARE_PROVIDER_SITE_OTHER): Payer: Medicare Other | Admitting: Gynecology

## 2013-03-13 DIAGNOSIS — Z9889 Other specified postprocedural states: Secondary | ICD-10-CM

## 2013-03-13 NOTE — Progress Notes (Signed)
Patient presents 1 month postop status post LAVH. I again reviewed pathology findings to include endometriosis with her. She's doing well. Last time I saw her she was a little out of sorts with some increased memory issues but discovered that she actually had mistaken her medications and was taking her sleeping pill in the morning. She's corrected this and is doing well.  Exam with [name] assistant Abdomen soft nontender without masses guarding rebound organomegaly. Incisions well healed. Pelvic external BUS vagina with cuff healing nicely. Bimanual without masses or tenderness.  Assessment and plan: Normal postop check at 4 weeks. She will slowly resume normal activities with the exception of continued pelvic rest for 2 more weeks. Assuming she does well then she will see me in the summer when she is due for her annual exam, sooner as needed.

## 2013-03-13 NOTE — Patient Instructions (Signed)
Slowly continue to resume normal activities. Continue with nothing in the vagina for 2 more weeks. Assuming you continue well then see me during the summer for your annual exam. Call or see me sooner if any issues.

## 2013-03-27 ENCOUNTER — Telehealth: Payer: Self-pay | Admitting: *Deleted

## 2013-03-27 NOTE — Telephone Encounter (Signed)
Pt called asking when okay to start back working out at the gym and when okay to have sex. I gave the following information to patient:" Slowly continue to resume normal activities. Continue with nothing in the vagina for 2 more weeks" per TF office note.

## 2013-08-08 DIAGNOSIS — C4491 Basal cell carcinoma of skin, unspecified: Secondary | ICD-10-CM

## 2013-08-08 HISTORY — DX: Basal cell carcinoma of skin, unspecified: C44.91

## 2014-05-01 ENCOUNTER — Ambulatory Visit (INDEPENDENT_AMBULATORY_CARE_PROVIDER_SITE_OTHER): Payer: Medicare Other | Admitting: Gynecology

## 2014-05-01 ENCOUNTER — Encounter: Payer: Self-pay | Admitting: Gynecology

## 2014-05-01 DIAGNOSIS — N644 Mastodynia: Secondary | ICD-10-CM

## 2014-05-01 NOTE — Patient Instructions (Signed)
Followup for mammogram as scheduled next week. Followup for annual GYN exam as scheduled in 2 weeks.

## 2014-05-01 NOTE — Progress Notes (Signed)
[name] 06/16/1971 [id number]        43 y.o.  G0P0 presents complaining of one to 2 week history of bilateral breast tenderness. Seems to be getting better over the last several days. No abnormalities on self breast exam and no nipple discharge. Status post LAVH LSO in the past for endometriosis.  Past medical history,surgical history, problem list, medications, allergies, family history and social history were all reviewed and documented in the EPIC chart.  Directed ROS with pertinent positives and negatives documented in the history of present illness/assessment and plan.  Exam: [name] assistant General appearance  Normal Both breasts examined lying and sitting without masses, retractions, discharge, adenopathy.  Assessment/Plan:  43 y.o. G0P0 with bilateral mastalgia now resolving. Exam is normal. Never had mamma. Has mammogram scheduled next week. Suspect physiologic. Will keep mammogram appointment. She also has an appointment to see me in 2 weeks for her annual exam. Assuming mammogram normal and tenderness totally resolves then we'll follow expectantly.   Note: This document was prepared with digital dictation and possible smart phrase technology. Any transcriptional errors that result from this process are unintentional.   Anastasio Auerbach MD, 10:36 AM 05/01/2014

## 2014-05-15 ENCOUNTER — Encounter: Payer: Self-pay | Admitting: Gynecology

## 2014-05-15 ENCOUNTER — Ambulatory Visit (INDEPENDENT_AMBULATORY_CARE_PROVIDER_SITE_OTHER): Payer: Medicare Other | Admitting: Gynecology

## 2014-05-15 VITALS — BP 122/78 | Ht 66.0 in | Wt 149.0 lb

## 2014-05-15 DIAGNOSIS — N809 Endometriosis, unspecified: Secondary | ICD-10-CM

## 2014-05-15 DIAGNOSIS — IMO0002 Reserved for concepts with insufficient information to code with codable children: Secondary | ICD-10-CM

## 2014-05-15 DIAGNOSIS — R6889 Other general symptoms and signs: Secondary | ICD-10-CM

## 2014-05-15 DIAGNOSIS — N644 Mastodynia: Secondary | ICD-10-CM

## 2014-05-15 NOTE — Progress Notes (Signed)
Brehanna Deveny [place]ry-Galvin [birth year] [id number]        43 y.o.  G0P0 for annual exam.  Several issues noted below.  Past medical history,surgical history, problem list, medications, allergies, family history and social history were all reviewed and documented as reviewed in the EPIC chart.  ROS:  12 system ROS performed with pertinent positives and negatives included in the history, assessment and plan.  Included Systems: General, HEENT, Neck, Cardiovascular, Pulmonary, Gastrointestinal, Genitourinary, Musculoskeletal, Dermatologic, Endocrine, Hematological, Neurologic, Psychiatric Additional significant findings :  None   Exam: [name] assistant Filed Vitals:   05/15/14 1414  BP: 122/78  Height: 5\' 6"  (1.676 m)  Weight: 149 lb (67.586 kg)   General appearance:  Normal affect, orientation and appearance. Skin: Grossly normal HEENT: Without gross lesions.  No cervical or supraclavicular adenopathy. Thyroid normal.  Lungs:  Clear without wheezing, rales or rhonchi Cardiac: RR, without RMG Abdominal:  Soft, nontender, without masses, guarding, rebound, organomegaly or hernia Breasts:  Examined lying and sitting without masses, retractions, discharge or axillary adenopathy. Pelvic:  Ext/BUS/vagina normal   Adnexa  Without masses or tenderness    Anus and perineum  Normal   Rectovaginal  Normal sphincter tone without palpated masses or tenderness.    Assessment/Plan:  43 y.o. G0P0 female for annual exam.   1. Status post LAVH, LSO in the past for endometriosis. Doing well without significant symptoms of hot flushes, night sweats, vaginal dryness, pelvic pain or dyspareunia. Continue to monitor. 2. Mastalgia. Recent exam for mastalgia was negative. She is in the process of arranging mammography. Notes that her mastalgia has resolved. Exam again today NED. Continue with self breast exams monthly and followup for mammogram. 3. Pap smear 2013. No Pap smear done today. History of LGSIL 2011 with  negative Pap smears after. Options to stop screening altogether as she is status post hysterectomy for benign indications or less frequent screening intervals reviewed. We'll plan on Pap smear next year at three-year interval and then address on an annual basis. 4. Health maintenance. No routine blood work done as patient reports this done through her primary physician's office. Followup in one year, sooner as needed.   Note: This document was prepared with digital dictation and possible smart phrase technology. Any transcriptional errors that result from this process are unintentional.   Anastasio Auerbach MD, 2:48 PM 05/15/2014

## 2014-05-15 NOTE — Patient Instructions (Signed)
Schedule your mammogram Followup in one year for annual exam, sooner if any issues. Continue to followup with her primary physician for routine health care and blood work.

## 2014-07-12 ENCOUNTER — Encounter: Payer: Self-pay | Admitting: Gynecology

## 2015-07-16 ENCOUNTER — Telehealth: Payer: Self-pay | Admitting: *Deleted

## 2015-07-16 NOTE — Telephone Encounter (Signed)
Pt called c/o hot flashes requesting treatment , I called pt and left message on voicemail OV best.

## 2015-07-18 ENCOUNTER — Ambulatory Visit (INDEPENDENT_AMBULATORY_CARE_PROVIDER_SITE_OTHER): Payer: Medicare Other | Admitting: Gynecology

## 2015-07-18 ENCOUNTER — Encounter: Payer: Self-pay | Admitting: Gynecology

## 2015-07-18 DIAGNOSIS — N951 Menopausal and female climacteric states: Secondary | ICD-10-CM

## 2015-07-18 LAB — TSH: TSH: 0.59 u[IU]/mL (ref 0.350–4.500)

## 2015-07-18 MED ORDER — FLUOXETINE HCL 10 MG PO TABS: 10.0000 mg | ORAL_TABLET | Freq: Every day | ORAL | Status: DC

## 2015-07-18 NOTE — Patient Instructions (Signed)
Start on the Prozac as we discussed. Call me in one month to see how you're doing. Call me sooner if you have any issues.

## 2015-07-18 NOTE — Progress Notes (Signed)
[name] Jun 27, 1971 [id number]        44 y.o.  G0P0 Presents to discuss increasing short temper and moodiness.. Having some hot flushes but not dramatic. Has been going on over the last several weeks. No hair or skin or weight changes. No vaginal dryness or irritation. Status post hysterectomy LSO in the past.  Past medical history,surgical history, problem list, medications, allergies, family history and social history were all reviewed and documented in the EPIC chart.  Directed ROS with pertinent positives and negatives documented in the history of present illness/assessment and plan.  Exam:  There were no vitals filed for this visit. General appearance:  Normal   Assessment/Plan:  44 y.o. G0P0 reviewed situation with the patient. I'm going to check a TSH FSH. Options for management were reviewed and ultimately we decided on an anxiolytic trial. Fluoxetine 10 mg daily #32 refills prescribed. She can recall her other physician to make sure they're comfortable with this in addition to her other medications that she uses for her seizures. She is going to call me in a month or 2 and we'll see how she is doing and think any dose adjustments as needed. I reviewed the side effect profile of the medication and risks up to including suicide ideation.    Anastasio Auerbach MD, 10:06 AM 07/18/2015

## 2015-07-19 LAB — FOLLICLE STIMULATING HORMONE: FSH: 5 m[IU]/mL

## 2015-07-23 ENCOUNTER — Telehealth: Payer: Self-pay | Admitting: *Deleted

## 2015-07-23 NOTE — Telephone Encounter (Signed)
Prior authorization form faxed to [facility] for Prozac 10 mg tablets, will wait for response.

## 2015-07-24 NOTE — Telephone Encounter (Signed)
Pt called back and said she is not going to start Prozac doesn't want it to interfere with the currently medication she takes.

## 2015-08-27 ENCOUNTER — Ambulatory Visit (INDEPENDENT_AMBULATORY_CARE_PROVIDER_SITE_OTHER): Payer: Medicare Other | Admitting: Gynecology

## 2015-08-27 ENCOUNTER — Encounter: Payer: Self-pay | Admitting: Gynecology

## 2015-08-27 VITALS — BP 116/74

## 2015-08-27 DIAGNOSIS — N898 Other specified noninflammatory disorders of vagina: Secondary | ICD-10-CM

## 2015-08-27 LAB — WET PREP FOR TRICH, YEAST, CLUE
Clue Cells Wet Prep HPF POC: NONE SEEN
Trich, Wet Prep: NONE SEEN
WBC, Wet Prep HPF POC: NONE SEEN
Yeast Wet Prep HPF POC: NONE SEEN

## 2015-08-27 MED ORDER — FLUCONAZOLE 150 MG PO TABS: 150.0000 mg | ORAL_TABLET | Freq: Once | ORAL | Status: DC

## 2015-08-27 MED ORDER — METRONIDAZOLE 500 MG PO TABS: 500.0000 mg | ORAL_TABLET | Freq: Two times a day (BID) | ORAL | Status: DC

## 2015-08-27 NOTE — Patient Instructions (Signed)
Take the Flagyl medication twice daily for 7 days. Avoid alcohol while taking. Take the Diflucan daily for 3 days. Follow up of the vaginal irritation continues.

## 2015-08-27 NOTE — Progress Notes (Signed)
[name] 1971/02/24 [id number]        44 y.o.  G0P0 Presents with her husband questioning whether she is having an early herpetic outbreak. Husband has a history of herpes with intermittent outbreaks. Has been extra careful not to exposure. They had intercourse approximately 3 weeks ago and the next day he broke out. She now is having a lot of vaginal irritation and discomfort in their suspecting that she is having an outbreak. She has no fever chills myalgias or arthralgias or inguinal tenderness. No vaginal odor or urinary symptoms.  Past medical history,surgical history, problem list, medications, allergies, family history and social history were all reviewed and documented in the EPIC chart.  Directed ROS with pertinent positives and negatives documented in the history of present illness/assessment and plan.  Exam: [name] assistant Filed Vitals:   08/27/15 1359  BP: 116/74   General appearance:  Normal Abdomen soft nontender without masses guarding rebound. No inguinal adenopathy or tenderness. Pelvic external BUS vagina with abundant white discharge. No lesions or ulcerations noted externally or internally. Bimanual exam without tenderness or masses.   Assessment/Plan:  44 y.o. G0P0 With vaginitis in discharge. Wet prep nonspecific. No evidence of overt HSV outbreak. Reviewed with her may be early and she may break out in another day or 2 or may be all due to vaginitis. On the cover her for both yeast and BV with Flagyl 500 mg twice a day 7 days and Diflucan 150 mg daily 3 days. Alcohol avoidance reviewed. Follow up if her symptoms persist worsen or recur. If she does start to develop ulcerations or breaks in the skin shows to call and I'll start her on  Valtrex. I did review the husband the options for daily suppressive medication for him to try to decrease transmission rate although not totally effective. He is going to discuss this with his doctor.    Anastasio Auerbach  MD, 2:38 PM 08/27/2015

## 2015-08-30 ENCOUNTER — Telehealth: Payer: Self-pay | Admitting: *Deleted

## 2015-08-30 NOTE — Telephone Encounter (Signed)
Patient informed with the below note.  

## 2015-08-30 NOTE — Telephone Encounter (Signed)
I would watch for now. He can take several days for the medication to take effect. If symptoms persist into next week then I would recommend office visit with Izora Gala or Dr. Toney Rakes.

## 2015-08-30 NOTE — Telephone Encounter (Signed)
Pt was seen on 08/27/15 treated for yeast and BV infection taking Flagyl 500 mg twice a day 7 days and Diflucan 150 mg daily. Pt said she feels pressure when standing only, states she mention this to you at office visit (she thought). No frequent urination, burning, lower back, fever or chills, states she did take one of her dogs Rx for prednisone by accident about 2 days ago. Also states vaginal area feels swallow. Pt would like your thoughts regarding this?

## 2015-09-25 ENCOUNTER — Ambulatory Visit (INDEPENDENT_AMBULATORY_CARE_PROVIDER_SITE_OTHER): Payer: Medicare Other | Admitting: Gynecology

## 2015-09-25 ENCOUNTER — Encounter: Payer: Self-pay | Admitting: Gynecology

## 2015-09-25 VITALS — BP 110/66 | Ht 66.0 in | Wt 152.0 lb

## 2015-09-25 DIAGNOSIS — Z01419 Encounter for gynecological examination (general) (routine) without abnormal findings: Secondary | ICD-10-CM

## 2015-09-25 NOTE — Patient Instructions (Signed)

## 2015-09-25 NOTE — Progress Notes (Signed)
[name] 19-Dec-1970 [id number]        44 y.o.  G0P0 for annual exam.  Doing well without complaints.  Past medical history,surgical history, problem list, medications, allergies, family history and social history were all reviewed and documented as reviewed in the EPIC chart.  ROS:  Performed with pertinent positives and negatives included in the history, assessment and plan.   Additional significant findings :  none   Exam: [name] Counsellor Vitals:   09/25/15 0953  BP: 110/66  Height: 5\' 6"  (1.676 m)  Weight: 152 lb (68.947 kg)   General appearance:  Normal affect, orientation and appearance. Skin: Grossly normal HEENT: Without gross lesions.  No cervical or supraclavicular adenopathy. Thyroid normal.  Lungs:  Clear without wheezing, rales or rhonchi Cardiac: RR, without RMG Abdominal:  Soft, nontender, without masses, guarding, rebound, organomegaly or hernia Breasts:  Examined lying and sitting without masses, retractions, discharge or axillary adenopathy. Pelvic:  Ext/BUS/vagina normal  Adnexa  Without masses or tenderness    Anus and perineum  Normal   Rectovaginal  Normal sphincter tone without palpated masses or tenderness.    Assessment/Plan:  44 y.o. G0P0 female for annual exam.   1. Status post LAVH 2014 with findings of endometriosis. Doing well without abdominal discomfort or menopausal type symptoms. Continue to monitor and report any issues. 2. Mammography scheduled this coming Friday. Continue with annual mammography. SBE monthly reviewed. 3. Pap smear/HPV negative 2013. No Pap smear done today. No history of abnormal Pap smears. Options to stop screening altogether versus less frequent screening intervals as she is status post hysterectomy for benign indications discussed. Will readdress on an annual basis. 4. Health maintenance. No routine blood work done as patient reports this done at her primary physician's office. Follow up 1 year, sooner as  needed.   Anastasio Auerbach MD, 10:26 AM 09/25/2015

## 2016-03-11 ENCOUNTER — Encounter: Payer: Self-pay | Admitting: Gynecology

## 2016-03-11 ENCOUNTER — Ambulatory Visit (INDEPENDENT_AMBULATORY_CARE_PROVIDER_SITE_OTHER): Payer: Medicare Other | Admitting: Gynecology

## 2016-03-11 VITALS — BP 118/66

## 2016-03-11 DIAGNOSIS — R300 Dysuria: Secondary | ICD-10-CM | POA: Diagnosis not present

## 2016-03-11 DIAGNOSIS — N898 Other specified noninflammatory disorders of vagina: Secondary | ICD-10-CM

## 2016-03-11 LAB — URINALYSIS W MICROSCOPIC + REFLEX CULTURE
Bilirubin Urine: NEGATIVE
Casts: NONE SEEN [LPF]
Glucose, UA: NEGATIVE
Hgb urine dipstick: NEGATIVE
Ketones, ur: NEGATIVE
Leukocytes, UA: NEGATIVE
Nitrite: NEGATIVE
Protein, ur: NEGATIVE
RBC / HPF: NONE SEEN RBC/HPF (ref <=2)
Specific Gravity, Urine: 1.015 (ref 1.001–1.035)
Yeast: NONE SEEN [HPF]
pH: 8.5 — ABNORMAL HIGH (ref 5.0–8.0)

## 2016-03-11 LAB — WET PREP FOR TRICH, YEAST, CLUE
Clue Cells Wet Prep HPF POC: NONE SEEN
Trich, Wet Prep: NONE SEEN
WBC, Wet Prep HPF POC: NONE SEEN
Yeast Wet Prep HPF POC: NONE SEEN

## 2016-03-11 MED ORDER — SULFAMETHOXAZOLE-TRIMETHOPRIM 800-160 MG PO TABS: 1.0000 | ORAL_TABLET | Freq: Two times a day (BID) | ORAL | Status: DC

## 2016-03-11 MED ORDER — METRONIDAZOLE 0.75 % VA GEL: 1.0000 | Freq: Two times a day (BID) | VAGINAL | Status: DC

## 2016-03-11 NOTE — Addendum Note (Signed)
Addended by: Nelva Nay on: 03/11/2016 12:52 PM   Modules accepted: Orders, SmartSet

## 2016-03-11 NOTE — Progress Notes (Signed)
    [name] 10-Aug-1971 [id number]        46 y.o.  G0P0 presents with several days of mild dysuria and vulvar swelling. No significant discharge odor itching. No low back pain fever chills frequency urgency.  Past medical history,surgical history, problem list, medications, allergies, family history and social history were all reviewed and documented in the EPIC chart.  Directed ROS with pertinent positives and negatives documented in the history of present illness/assessment and plan.  Exam: Caryn Bee assistant Filed Vitals:   03/11/16 1155  BP: 118/66   General appearance:  Normal Spine straight without CVA tenderness Abdomen soft nontender without masses guarding rebound Pelvic external BUS vagina slight white discharge. Bimanual without masses or tenderness  Assessment/Plan:  45 y.o. G0P0 with urine analysis showing few bacteria and 0-5 WBC. Wet prep unremarkable. Patients leaving out of town and I'm going to cover her for early UTI with Septra DS 1 by mouth twice a day 3 days and for early bacterial vaginosis accounting for her swelling feeling with her white discharge with MetroGel nightly 5 nights. Follow up if symptoms persist, worsen or recur.    Anastasio Auerbach MD, 12:25 PM 03/11/2016

## 2016-03-11 NOTE — Patient Instructions (Signed)
Take the antibiotic pill twice daily for 3 days Use the vaginal cream in the vagina nightly for 5 days. Follow up if your symptoms persist, worsen or recur.

## 2016-03-12 ENCOUNTER — Telehealth: Payer: Self-pay | Admitting: *Deleted

## 2016-03-12 LAB — URINE CULTURE: Colony Count: 50000

## 2016-03-12 NOTE — Telephone Encounter (Signed)
Pt was seen yesterday and prescribed metrogel and septra D # 6 x 3 days. Pt is leaving out of town tomorrow she read the side effects from the Metrogel and it mention tingling hands and feet, pt said she had epilepsy and with her history she wanted to confirm okay to take medication? Please advise

## 2016-03-12 NOTE — Telephone Encounter (Signed)
I can never guarantee but I think that would be unlikely because it is a topical cream with limited absorption

## 2016-03-12 NOTE — Telephone Encounter (Signed)
Left the below on pt voicemail. 

## 2016-09-21 ENCOUNTER — Ambulatory Visit (INDEPENDENT_AMBULATORY_CARE_PROVIDER_SITE_OTHER): Payer: Medicare Other | Admitting: Gynecology

## 2016-09-21 ENCOUNTER — Encounter: Payer: Self-pay | Admitting: Gynecology

## 2016-09-21 VITALS — BP 116/76

## 2016-09-21 DIAGNOSIS — K645 Perianal venous thrombosis: Secondary | ICD-10-CM | POA: Diagnosis not present

## 2016-09-21 MED ORDER — LIDOCAINE (ANORECTAL) 5 % EX CREA: 1.0000 "application " | TOPICAL_CREAM | Freq: Four times a day (QID) | CUTANEOUS | 1 refills | Status: DC | PRN

## 2016-09-21 NOTE — Patient Instructions (Signed)
Apply the prescribed cream 4 times daily as needed for pain.

## 2016-09-21 NOTE — Progress Notes (Signed)
    [name] 1971-03-02 [id number]        45 y.o.  G0P0 presents with one-day history of swelling and pain in the perirectal area. No history of same before. No diarrhea or constipation. No rectal bleeding. No fever or chills.  Past medical history,surgical history, problem list, medications, allergies, family history and social history were all reviewed and documented in the EPIC chart.  Directed ROS with pertinent positives and negatives documented in the history of present illness/assessment and plan.  Exam: Caryn Bee assistant Vitals:   09/21/16 0909  BP: 116/76   General appearance:  Normal Abdomen soft nontender without masses guarding rebound Pelvic external BUS vagina normal. Bimanual exam without masses or tenderness. Rectal exam shows classic acute thrombosed hemorrhoid 3:00 position. Approximately 1.5 cm across. No bleeding. Digital exam without internal palpable abnormalities  Assessment/Plan:  45 y.o. G0P0 with acute thrombosed hemorrhoid significantly uncomfortable for the patient. Options for general surgical referral for excision versus topical treatment discussed. We will start with anal rectal lidocaine 5% cream 3-4 times daily. If improvement and resolution done will follow. If persists or certainly worsens then patient will call and we will refer to general surgery.    Anastasio Auerbach MD, 9:26 AM 09/21/2016

## 2016-10-12 ENCOUNTER — Encounter: Payer: Self-pay | Admitting: Gynecology

## 2016-10-12 ENCOUNTER — Ambulatory Visit (INDEPENDENT_AMBULATORY_CARE_PROVIDER_SITE_OTHER): Payer: Medicare Other | Admitting: Gynecology

## 2016-10-12 VITALS — BP 120/72 | Ht 66.0 in | Wt 152.0 lb

## 2016-10-12 DIAGNOSIS — K645 Perianal venous thrombosis: Secondary | ICD-10-CM | POA: Diagnosis not present

## 2016-10-12 DIAGNOSIS — Z01411 Encounter for gynecological examination (general) (routine) with abnormal findings: Secondary | ICD-10-CM | POA: Diagnosis not present

## 2016-10-12 DIAGNOSIS — N898 Other specified noninflammatory disorders of vagina: Secondary | ICD-10-CM | POA: Diagnosis not present

## 2016-10-12 LAB — WET PREP FOR TRICH, YEAST, CLUE: Trich, Wet Prep: NONE SEEN

## 2016-10-12 MED ORDER — FLUCONAZOLE 150 MG PO TABS: 150.0000 mg | ORAL_TABLET | Freq: Once | ORAL | 0 refills | Status: AC

## 2016-10-12 NOTE — Addendum Note (Signed)
Addended by: Nelva Nay on: 10/12/2016 04:16 PM   Modules accepted: Orders

## 2016-10-12 NOTE — Progress Notes (Signed)
    [name] 06-18-1971 [id number]        45 y.o.  G0P0  for annual exam.  Several issues noted below.  Past medical history,surgical history, problem list, medications, allergies, family history and social history were all reviewed and documented as reviewed in the EPIC chart.  ROS:  Performed with pertinent positives and negatives included in the history, assessment and plan.   Additional significant findings :  None   Exam: Caryn Bee assistant Vitals:   10/12/16 1438  BP: 120/72  Weight: 152 lb (68.9 kg)  Height: 5\' 6"  (1.676 m)   Body mass index is 24.53 kg/m.  General appearance:  Normal affect, orientation and appearance. Skin: Grossly normal HEENT: Without gross lesions.  No cervical or supraclavicular adenopathy. Thyroid normal.  Lungs:  Clear without wheezing, rales or rhonchi Cardiac: RR, without RMG Abdominal:  Soft, nontender, without masses, guarding, rebound, organomegaly or hernia Breasts:  Examined lying and sitting without masses, retractions, discharge or axillary adenopathy. Pelvic:  Ext, BUS, Vagina with thick cakey white discharge.  Adnexa without masses or tenderness    Anus and perineum normal   Rectovaginal normal sphincter tone without palpated masses or tenderness. Thrombosed external hemorrhoid noted as was previously diagnosed 09/21/2016.   Assessment/Plan:  45 y.o. G0P0 female for annual exam status post LAVH 2014 with findings of endometriosis.Marland Kitchen   1. Thick white discharge. Wet prep is positive for yeast. Patient without significant symptoms of itching or irritation. Recommended we go ahead and cover her with Diflucan 150 mg 1 dose before she becomes symptomatic and she agrees. Will follow up if she would develop symptoms despite this. 2. External hemorrhoid thrombosed. Not overly bothersome to the patient from a discomfort standpoint. Classic in appearance. Treatment options to include OTC products reviewed. If would start to  bother her or certainly enlarged she knows to call and I'll refer to surgery. 3. Mammography due and patient knows the need to schedule this. 4. Pap smear/HPV 2013 negative. No Pap smear done today. Options to stop screening she is status post hysterectomy for benign indications versus less frequent screening intervals reviewed. Will readdress on annual basis. 5. Health maintenance. No routine lab work done as patient reports is done elsewhere. Follow up 1 year, sooner as needed.  Additional time in excess of her routine gynecologic exam was spent in direct face to face counseling and coordination of care in regards to her vaginal yeast infection and thrombosed external hemorrhoid.    Anastasio Auerbach MD, 3:14 PM 10/12/2016

## 2016-10-12 NOTE — Patient Instructions (Addendum)
Take the one Diflucan pill for the yeast infection.  Schedule your mammogram  You may obtain a copy of any labs that were done today by logging onto MyChart as outlined in the instructions provided with your AVS (after visit summary). The office will not call with normal lab results but certainly if there are any significant abnormalities then we will contact you.   Health Maintenance Adopting a healthy lifestyle and getting preventive care can go a long way to promote health and wellness. Talk with your health care provider about what schedule of regular examinations is right for you. This is a good chance for you to check in with your provider about disease prevention and staying healthy. In between checkups, there are plenty of things you can do on your own. Experts have done a lot of research about which lifestyle changes and preventive measures are most likely to keep you healthy. Ask your health care provider for more information. WEIGHT AND DIET  Eat a healthy diet  Be sure to include plenty of vegetables, fruits, low-fat dairy products, and lean protein.  Do not eat a lot of foods high in solid fats, added sugars, or salt.  Get regular exercise. This is one of the most important things you can do for your health.  Most adults should exercise for at least 150 minutes each week. The exercise should increase your heart rate and make you sweat (moderate-intensity exercise).  Most adults should also do strengthening exercises at least twice a week. This is in addition to the moderate-intensity exercise.  Maintain a healthy weight  Body mass index (BMI) is a measurement that can be used to identify possible weight problems. It estimates body fat based on height and weight. Your health care provider can help determine your BMI and help you achieve or maintain a healthy weight.  For females 51 years of age and older:   A BMI below 18.5 is considered underweight.  A BMI of 18.5 to 24.9  is normal.  A BMI of 25 to 29.9 is considered overweight.  A BMI of 30 and above is considered obese.  Watch levels of cholesterol and blood lipids  You should start having your blood tested for lipids and cholesterol at 46 years of age, then have this test every 5 years.  You may need to have your cholesterol levels checked more often if:  Your lipid or cholesterol levels are high.  You are older than 46 years of age.  You are at high risk for heart disease.  CANCER SCREENING   Lung Cancer  Lung cancer screening is recommended for adults 44-52 years old who are at high risk for lung cancer because of a history of smoking.  A yearly low-dose CT scan of the lungs is recommended for people who:  Currently smoke.  Have quit within the past 15 years.  Have at least a 30-pack-year history of smoking. A pack year is smoking an average of one pack of cigarettes a day for 1 year.  Yearly screening should continue until it has been 15 years since you quit.  Yearly screening should stop if you develop a health problem that would prevent you from having lung cancer treatment.  Breast Cancer  Practice breast self-awareness. This means understanding how your breasts normally appear and feel.  It also means doing regular breast self-exams. Let your health care provider know about any changes, no matter how small.  If you are in your 20s or 30s, you  should have a clinical breast exam (CBE) by a health care provider every 1-3 years as part of a regular health exam.  If you are 84 or older, have a CBE every year. Also consider having a breast X-ray (mammogram) every year.  If you have a family history of breast cancer, talk to your health care provider about genetic screening.  If you are at high risk for breast cancer, talk to your health care provider about having an MRI and a mammogram every year.  Breast cancer gene (BRCA) assessment is recommended for women who have family  members with BRCA-related cancers. BRCA-related cancers include:  Breast.  Ovarian.  Tubal.  Peritoneal cancers.  Results of the assessment will determine the need for genetic counseling and BRCA1 and BRCA2 testing. Cervical Cancer Routine pelvic examinations to screen for cervical cancer are no longer recommended for nonpregnant women who are considered low risk for cancer of the pelvic organs (ovaries, uterus, and vagina) and who do not have symptoms. A pelvic examination may be necessary if you have symptoms including those associated with pelvic infections. Ask your health care provider if a screening pelvic exam is right for you.   The Pap test is the screening test for cervical cancer for women who are considered at risk.  If you had a hysterectomy for a problem that was not cancer or a condition that could lead to cancer, then you no longer need Pap tests.  If you are older than 65 years, and you have had normal Pap tests for the past 10 years, you no longer need to have Pap tests.  If you have had past treatment for cervical cancer or a condition that could lead to cancer, you need Pap tests and screening for cancer for at least 20 years after your treatment.  If you no longer get a Pap test, assess your risk factors if they change (such as having a new sexual partner). This can affect whether you should start being screened again.  Some women have medical problems that increase their chance of getting cervical cancer. If this is the case for you, your health care provider may recommend more frequent screening and Pap tests.  The human papillomavirus (HPV) test is another test that may be used for cervical cancer screening. The HPV test looks for the virus that can cause cell changes in the cervix. The cells collected during the Pap test can be tested for HPV.  The HPV test can be used to screen women 59 years of age and older. Getting tested for HPV can extend the interval  between normal Pap tests from three to five years.  An HPV test also should be used to screen women of any age who have unclear Pap test results.  After 46 years of age, women should have HPV testing as often as Pap tests.  Colorectal Cancer  This type of cancer can be detected and often prevented.  Routine colorectal cancer screening usually begins at 46 years of age and continues through 46 years of age.  Your health care provider may recommend screening at an earlier age if you have risk factors for colon cancer.  Your health care provider may also recommend using home test kits to check for hidden blood in the stool.  A small camera at the end of a tube can be used to examine your colon directly (sigmoidoscopy or colonoscopy). This is done to check for the earliest forms of colorectal cancer.  Routine screening  usually begins at age 35.  Direct examination of the colon should be repeated every 5-10 years through 46 years of age. However, you may need to be screened more often if early forms of precancerous polyps or small growths are found. Skin Cancer  Check your skin from head to toe regularly.  Tell your health care provider about any new moles or changes in moles, especially if there is a change in a mole's shape or color.  Also tell your health care provider if you have a mole that is larger than the size of a pencil eraser.  Always use sunscreen. Apply sunscreen liberally and repeatedly throughout the day.  Protect yourself by wearing long sleeves, pants, a wide-brimmed hat, and sunglasses whenever you are outside. HEART DISEASE, DIABETES, AND HIGH BLOOD PRESSURE   Have your blood pressure checked at least every 1-2 years. High blood pressure causes heart disease and increases the risk of stroke.  If you are between 54 years and 23 years old, ask your health care provider if you should take aspirin to prevent strokes.  Have regular diabetes screenings. This involves  taking a blood sample to check your fasting blood sugar level.  If you are at a normal weight and have a low risk for diabetes, have this test once every three years after 46 years of age.  If you are overweight and have a high risk for diabetes, consider being tested at a younger age or more often. PREVENTING INFECTION  Hepatitis B  If you have a higher risk for hepatitis B, you should be screened for this virus. You are considered at high risk for hepatitis B if:  You were born in a country where hepatitis B is common. Ask your health care provider which countries are considered high risk.  Your parents were born in a high-risk country, and you have not been immunized against hepatitis B (hepatitis B vaccine).  You have HIV or AIDS.  You use needles to inject street drugs.  You live with someone who has hepatitis B.  You have had sex with someone who has hepatitis B.  You get hemodialysis treatment.  You take certain medicines for conditions, including cancer, organ transplantation, and autoimmune conditions. Hepatitis C  Blood testing is recommended for:  Everyone born from [birth year] through 1966.  Anyone with known risk factors for hepatitis C. Sexually transmitted infections (STIs)  You should be screened for sexually transmitted infections (STIs) including gonorrhea and chlamydia if:  You are sexually active and are younger than 46 years of age.  You are older than 46 years of age and your health care provider tells you that you are at risk for this type of infection.  Your sexual activity has changed since you were last screened and you are at an increased risk for chlamydia or gonorrhea. Ask your health care provider if you are at risk.  If you do not have HIV, but are at risk, it may be recommended that you take a prescription medicine daily to prevent HIV infection. This is called pre-exposure prophylaxis (PrEP). You are considered at risk if:  You are sexually active  and do not regularly use condoms or know the HIV status of your partner(s).  You take drugs by injection.  You are sexually active with a partner who has HIV. Talk with your health care provider about whether you are at high risk of being infected with HIV. If you choose to begin PrEP, you should first be tested  for HIV. You should then be tested every 3 months for as long as you are taking PrEP.  PREGNANCY   If you are premenopausal and you may become pregnant, ask your health care provider about preconception counseling.  If you may become pregnant, take 400 to 800 micrograms (mcg) of folic acid every day.  If you want to prevent pregnancy, talk to your health care provider about birth control (contraception). OSTEOPOROSIS AND MENOPAUSE   Osteoporosis is a disease in which the bones lose minerals and strength with aging. This can result in serious bone fractures. Your risk for osteoporosis can be identified using a bone density scan.  If you are 23 years of age or older, or if you are at risk for osteoporosis and fractures, ask your health care provider if you should be screened.  Ask your health care provider whether you should take a calcium or vitamin D supplement to lower your risk for osteoporosis.  Menopause may have certain physical symptoms and risks.  Hormone replacement therapy may reduce some of these symptoms and risks. Talk to your health care provider about whether hormone replacement therapy is right for you.  HOME CARE INSTRUCTIONS   Schedule regular health, dental, and eye exams.  Stay current with your immunizations.   Do not use any tobacco products including cigarettes, chewing tobacco, or electronic cigarettes.  If you are pregnant, do not drink alcohol.  If you are breastfeeding, limit how much and how often you drink alcohol.  Limit alcohol intake to no more than 1 drink per day for nonpregnant women. One drink equals 12 ounces of beer, 5 ounces of wine,  or 1 ounces of hard liquor.  Do not use street drugs.  Do not share needles.  Ask your health care provider for help if you need support or information about quitting drugs.  Tell your health care provider if you often feel depressed.  Tell your health care provider if you have ever been abused or do not feel safe at home. Document Released: 06/01/2011 Document Revised: 04/02/2014 Document Reviewed: 10/18/2013 [facility] Patient Information 2015 [place], [place]. This information is not intended to replace advice given to you by your health care provider. Make sure you discuss any questions you have with your health care provider.

## 2018-07-25 ENCOUNTER — Ambulatory Visit: Payer: Medicare Other | Admitting: Gynecology

## 2018-07-25 ENCOUNTER — Encounter: Payer: Self-pay | Admitting: Gynecology

## 2018-07-25 VITALS — BP 120/78

## 2018-07-25 DIAGNOSIS — N644 Mastodynia: Secondary | ICD-10-CM | POA: Diagnosis not present

## 2018-07-25 MED ORDER — IBUPROFEN 800 MG PO TABS: 800.0000 mg | ORAL_TABLET | Freq: Three times a day (TID) | ORAL | 1 refills | Status: DC | PRN

## 2018-07-25 NOTE — Progress Notes (Signed)
    [name] July 19, 1971 [id number]        47 y.o.  G0P0 presents with 2 weeks of bilateral breast tenderness.  She is status post LAVH LSO in the past but does have her right ovary.  She has never had tenderness like this previously.  Also notes that her nipples seem more erect on both sides.  No nipple discharge.  Past medical history,surgical history, problem list, medications, allergies, family history and social history were all reviewed and documented in the EPIC chart.  Directed ROS with pertinent positives and negatives documented in the history of present illness/assessment and plan.  Exam: Caryn Bee assistant Vitals:   07/25/18 1622  BP: 120/78   General appearance:  Normal Both breasts examined lying and sitting without masses retractions discharge adenopathy.  She is diffusely tender throughout both breasts more so in the tail of Spence regions bilaterally.  Assessment/Plan:  47 y.o. G0P0 with bilateral breast tenderness and normal exam.  No localizing symptoms but a more diffuse pattern.  She is status post LAVH LSO in the past so it is difficult to assess where she would be in her cycle.  Recommended ibuprofen 800 mg 3 times daily #30 with 1 refill to take for the next several days continuously.  Recommended heat to both breasts.  Possible androgen suppression such as danazol also discussed.  Last mammogram several years ago.  I did recommend diagnostic mammography but I would like for her to see if her tenderness does not decrease before doing so as it would be a very uncomfortable.  We will also allow for ultrasound at the radiologist discretion.    Anastasio Auerbach MD, 4:52 PM 07/25/2018

## 2018-07-25 NOTE — Patient Instructions (Signed)
Take the prescription strength ibuprofen 3 times daily and apply heat to the breasts.  The office will call to schedule a diagnostic mammogram and possible ultrasound on both breasts.

## 2018-07-26 ENCOUNTER — Telehealth: Payer: Self-pay | Admitting: *Deleted

## 2018-07-26 DIAGNOSIS — N644 Mastodynia: Secondary | ICD-10-CM

## 2018-07-26 NOTE — Telephone Encounter (Signed)
Patient scheduled on 08/16/18 @ 9:00am at breast center. Patient informed.

## 2018-07-26 NOTE — Telephone Encounter (Signed)
-----   Message from Anastasio Auerbach, MD sent at 07/25/2018  4:55 PM EDT ----- Schedule bilateral diagnostic mammography and possible ultrasounds reference new onset bilateral breast tenderness.  Recommend starting with the mammography and ultrasound at the radiologist discretion.  Schedule 3 weeks from now as patient will be out of town next week

## 2018-08-16 ENCOUNTER — Ambulatory Visit: Payer: Self-pay

## 2018-08-16 ENCOUNTER — Other Ambulatory Visit: Payer: Self-pay

## 2018-08-16 ENCOUNTER — Ambulatory Visit
Admission: RE | Admit: 2018-08-16 | Discharge: 2018-08-16 | Disposition: A | Discharge status: Home / Self Care | Payer: Medicare Other | Source: Ambulatory Visit | Attending: Gynecology | Admitting: Gynecology

## 2018-08-16 DIAGNOSIS — N644 Mastodynia: Secondary | ICD-10-CM

## 2018-08-27 ENCOUNTER — Other Ambulatory Visit: Payer: Self-pay | Admitting: Gynecology

## 2018-08-29 NOTE — Telephone Encounter (Signed)
CE scheduled 09/22/18.

## 2018-09-22 ENCOUNTER — Encounter: Payer: Medicare Other | Admitting: Gynecology

## 2018-09-26 ENCOUNTER — Encounter: Payer: Medicare Other | Admitting: Gynecology

## 2018-10-19 ENCOUNTER — Ambulatory Visit
Admission: RE | Admit: 2018-10-19 | Discharge: 2018-10-19 | Disposition: A | Discharge status: Home / Self Care | Payer: Medicare Other | Source: Ambulatory Visit | Attending: Internal Medicine | Admitting: Internal Medicine

## 2018-10-19 ENCOUNTER — Other Ambulatory Visit: Payer: Self-pay | Admitting: Internal Medicine

## 2018-10-19 DIAGNOSIS — M25552 Pain in left hip: Secondary | ICD-10-CM

## 2019-06-14 ENCOUNTER — Other Ambulatory Visit: Payer: Self-pay | Admitting: Orthopaedic Surgery

## 2019-06-30 ENCOUNTER — Telehealth: Payer: Self-pay | Admitting: *Deleted

## 2019-06-30 NOTE — Telephone Encounter (Signed)
Patient called and left message in triage asking for a call back has questions. I called and left message for patient to call.

## 2019-07-03 ENCOUNTER — Encounter: Payer: Self-pay | Admitting: Gynecology

## 2019-07-03 ENCOUNTER — Other Ambulatory Visit: Payer: Self-pay

## 2019-07-03 ENCOUNTER — Ambulatory Visit: Payer: Medicare Other | Admitting: Gynecology

## 2019-07-03 VITALS — BP 122/78

## 2019-07-03 DIAGNOSIS — A609 Anogenital herpesviral infection, unspecified: Secondary | ICD-10-CM | POA: Diagnosis not present

## 2019-07-03 MED ORDER — VALACYCLOVIR HCL 1 G PO TABS: 1000.0000 mg | ORAL_TABLET | Freq: Two times a day (BID) | ORAL | 0 refills | Status: DC

## 2019-07-03 NOTE — Addendum Note (Signed)
Addended by: Nelva Nay on: 07/03/2019 11:48 AM   Modules accepted: Orders

## 2019-07-03 NOTE — Progress Notes (Signed)
    [name] Nov 22, 1971 [id number]        47 y.o.  G0P0 presents complaining of about a weeks worth of left lower pain.  Initially thought to be a left lower quadrant but on further questioning it is left buttocks.  No history of same before.  Status post LAVH LSO in the past for endometriosis  Past medical history,surgical history, problem list, medications, allergies, family history and social history were all reviewed and documented in the EPIC chart.  Directed ROS with pertinent positives and negatives documented in the history of present illness/assessment and plan.  Exam: Caryn Bee assistant Vitals:   07/03/19 1002  BP: 122/78   General appearance:  Normal Abdomen soft nontender without masses guarding rebound Pelvic external BUS vagina normal.  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  Bimanual exam without masses or tenderness.  Rectal exam is normal  Assessment/Plan:  48 y.o. G0P0 with cluster of scabbed over punctated areas consistent with HSV.  The patient's husband has a history of HSV.  We discussed whether this is a primary versus recurrent area noting she is never had a history of outbreaks although her memory is poor.  HSV culture done.  Will start Valtrex 1000 milligrams twice daily x7 days.  We will then follow and if has any recurrence will re-present.  Discussed possible daily suppressive dose if becomes a recurrent issue.  We will follow-up on the culture results.    Anastasio Auerbach MD, 10:28 AM 07/03/2019

## 2019-07-03 NOTE — Patient Instructions (Addendum)
Start the prescribed medication twice daily for 7 days.

## 2019-07-05 ENCOUNTER — Telehealth: Payer: Self-pay | Admitting: *Deleted

## 2019-07-05 ENCOUNTER — Encounter (HOSPITAL_COMMUNITY): Payer: Self-pay

## 2019-07-05 MED ORDER — ACYCLOVIR 5 % EX OINT: 1.0000 "application " | TOPICAL_OINTMENT | Freq: Every day | CUTANEOUS | 0 refills | Status: AC | PRN

## 2019-07-05 NOTE — Telephone Encounter (Signed)
Patient informed. Rx sent 

## 2019-07-05 NOTE — Patient Instructions (Addendum)
DUE TO COVID-19 ONLY ONE VISITOR IS ALLOWED TO VISIT DURING VISITOR HOURS ONLY!!   COVID SWAB TESTING MUST BE COMPLETED ON: Friday, Aug. 7, 2020 at 10:45AM    [address] [place] -[facility] enter pre surgical testing line (Must self quarantine after testing. Follow instructions on handout.)             Your procedure is scheduled on: Tuesday, Aug. 11, 2020   Report to [facility] Main  Entrance    Report to admitting at 12:40 PM   Call this number if you have problems the morning of surgery [phone]   Do not eat food :After Midnight.   May have liquids until 12:10PM day of surgery   CLEAR LIQUID DIET  Foods Allowed                                                                     Foods Excluded  Water, Black Coffee and tea, regular and decaf                             liquids that you cannot  Plain Jell-O in any flavor  (No red)                                           see through such as: Fruit ices (not with fruit pulp)                                     milk, soups, orange juice  Iced Popsicles (No red)                                    All solid food Carbonated beverages, regular and diet                                    Apple juices Sports drinks like Gatorade (No red) Lightly seasoned clear broth or consume(fat free) Sugar, honey syrup  Sample Menu Breakfast                                Lunch                                     Supper Cranberry juice                    Beef broth                            Chicken broth Jell-O  Grape juice                           Apple juice Coffee or tea                        Jell-O                                      Popsicle                                                Coffee or tea                        Coffee or tea   Complete one Ensure drink the morning of surgery at 12:10PM the day of surgery.   Brush your teeth the morning of  surgery.   Do NOT smoke after Midnight   Take these medicines the morning of surgery with A SIP OF WATER: Lamotrigine, Keppra, Levothyroxine, Oxcarbazepine, Alprazalom if needed                               You may not have any metal on your body including hair pins, jewelry, and body piercings             Do not wear make-up, lotions, powders, perfumes/cologne, or deodorant             Do not wear nail polish.  Do not shave  48 hours prior to surgery.                Do not bring valuables to the hospital. [place].   Contacts, dentures or bridgework may not be worn into surgery.   Bring small overnight bag day of surgery.    Special Instructions: Bring a copy of your healthcare power of attorney and living will documents         the day of surgery if you haven't scanned them in before.              Please read over the following fact sheets you were given:   [facility] - Preparing for Surgery Before surgery, you can play an important role.  Because skin is not sterile, your skin needs to be as free of germs as possible.  You can reduce the number of germs on your skin by washing with CHG (chlorahexidine gluconate) soap before surgery.  CHG is an antiseptic cleaner which kills germs and bonds with the skin to continue killing germs even after washing. Please DO NOT use if you have an allergy to CHG or antibacterial soaps.  If your skin becomes reddened/irritated stop using the CHG and inform your nurse when you arrive at Short Stay. Do not shave (including legs and underarms) for at least 48 hours prior to the first CHG shower.  You may shave your face/neck.  Please follow these instructions carefully:  1.  Shower with CHG Soap the night before surgery and the  morning of surgery.  2.  If you choose to wash your hair, wash your hair first as usual with your normal  shampoo.  3.  After you shampoo, rinse your hair and body thoroughly to  remove the shampoo.                             4.  Use CHG as you would any other liquid soap.  You can apply chg directly to the skin and wash.  Gently with a scrungie or clean washcloth.  5.  Apply the CHG Soap to your body ONLY FROM THE NECK DOWN.   Do   not use on face/ open                           Wound or open sores. Avoid contact with eyes, ears mouth and   genitals (private parts).                       Wash face,  Genitals (private parts) with your normal soap.             6.  Wash thoroughly, paying special attention to the area where your    surgery  will be performed.  7.  Thoroughly rinse your body with warm water from the neck down.  8.  DO NOT shower/wash with your normal soap after using and rinsing off the CHG Soap.                9.  Pat yourself dry with a clean towel.            10.  Wear clean pajamas.            11.  Place clean sheets on your bed the night of your first shower and do not  sleep with pets. Day of Surgery : Do not apply any lotions/deodorants the morning of surgery.  Please wear clean clothes to the hospital/surgery center.  FAILURE TO FOLLOW THESE INSTRUCTIONS MAY RESULT IN THE CANCELLATION OF YOUR SURGERY  PATIENT SIGNATURE_________________________________  NURSE SIGNATURE__________________________________  ________________________________________________________________________   Adam Phenix  An incentive spirometer is a tool that can help keep your lungs clear and active. This tool measures how well you are filling your lungs with each breath. Taking long deep breaths may help reverse or decrease the chance of developing breathing (pulmonary) problems (especially infection) following:  A long period of time when you are unable to move or be active. BEFORE THE PROCEDURE   If the spirometer includes an indicator to show your best effort, your nurse or respiratory therapist will set it to a desired goal.  If possible, sit up straight  or lean slightly forward. Try not to slouch.  Hold the incentive spirometer in an upright position. INSTRUCTIONS FOR USE  1. Sit on the edge of your bed if possible, or sit up as far as you can in bed or on a chair. 2. Hold the incentive spirometer in an upright position. 3. Breathe out normally. 4. Place the mouthpiece in your mouth and seal your lips tightly around it. 5. Breathe in slowly and as deeply as possible, raising the piston or the ball toward the top of the column. 6. Hold your breath for 3-5 seconds or for as long as possible. Allow the piston or ball to fall to the bottom of the column. 7. Remove the mouthpiece from your mouth  and breathe out normally. 8. Rest for a few seconds and repeat Steps 1 through 7 at least 10 times every 1-2 hours when you are awake. Take your time and take a few normal breaths between deep breaths. 9. The spirometer may include an indicator to show your best effort. Use the indicator as a goal to work toward during each repetition. 10. After each set of 10 deep breaths, practice coughing to be sure your lungs are clear. If you have an incision (the cut made at the time of surgery), support your incision when coughing by placing a pillow or rolled up towels firmly against it. Once you are able to get out of bed, walk around indoors and cough well. You may stop using the incentive spirometer when instructed by your caregiver.  RISKS AND COMPLICATIONS  Take your time so you do not get dizzy or light-headed.  If you are in pain, you may need to take or ask for pain medication before doing incentive spirometry. It is harder to take a deep breath if you are having pain. AFTER USE  Rest and breathe slowly and easily.  It can be helpful to keep track of a log of your progress. Your caregiver can provide you with a simple table to help with this. If you are using the spirometer at home, follow these instructions: [place] IF:   You are having  difficultly using the spirometer.  You have trouble using the spirometer as often as instructed.  Your pain medication is not giving enough relief while using the spirometer.  You develop fever of 100.5 F (38.1 C) or higher. SEEK IMMEDIATE MEDICAL CARE IF:   You cough up bloody sputum that had not been present before.  You develop fever of 102 F (38.9 C) or greater.  You develop worsening pain at or near the incision site. MAKE SURE YOU:   Understand these instructions.  Will watch your condition.  Will get help right away if you are not doing well or get worse. Document Released: 03/29/2007 Document Revised: 02/08/2012 Document Reviewed: 05/30/2007 ExitCare Patient Information 2014 [employer], [place].   ________________________________________________________________________  WHAT IS A BLOOD TRANSFUSION? Blood Transfusion Information  A transfusion is the replacement of blood or some of its parts. Blood is made up of multiple cells which provide different functions.  Red blood cells carry oxygen and are used for blood loss replacement.  White blood cells fight against infection.  Platelets control bleeding.  Plasma helps clot blood.  Other blood products are available for specialized needs, such as hemophilia or other clotting disorders. BEFORE THE TRANSFUSION  Who gives blood for transfusions?   Healthy volunteers who are fully evaluated to make sure their blood is safe. This is blood bank blood. Transfusion therapy is the safest it has ever been in the practice of medicine. Before blood is taken from a donor, a complete history is taken to make sure that person has no history of diseases nor engages in risky social behavior (examples are intravenous drug use or sexual activity with multiple partners). The donor's travel history is screened to minimize risk of transmitting infections, such as malaria. The donated blood is tested for signs of infectious diseases, such as  HIV and hepatitis. The blood is then tested to be sure it is compatible with you in order to minimize the chance of a transfusion reaction. If you or a relative donates blood, this is often done in anticipation of surgery and is not appropriate  for emergency situations. It takes many days to process the donated blood. RISKS AND COMPLICATIONS Although transfusion therapy is very safe and saves many lives, the main dangers of transfusion include:   Getting an infectious disease.  Developing a transfusion reaction. This is an allergic reaction to something in the blood you were given. Every precaution is taken to prevent this. The decision to have a blood transfusion has been considered carefully by your caregiver before blood is given. Blood is not given unless the benefits outweigh the risks. AFTER THE TRANSFUSION  Right after receiving a blood transfusion, you will usually feel much better and more energetic. This is especially true if your red blood cells have gotten low (anemic). The transfusion raises the level of the red blood cells which carry oxygen, and this usually causes an energy increase.  The nurse administering the transfusion will monitor you carefully for complications. HOME CARE INSTRUCTIONS  No special instructions are needed after a transfusion. You may find your energy is better. Speak with your caregiver about any limitations on activity for underlying diseases you may have. SEEK MEDICAL CARE IF:   Your condition is not improving after your transfusion.  You develop redness or irritation at the intravenous (IV) site. SEEK IMMEDIATE MEDICAL CARE IF:  Any of the following symptoms occur over the next 12 hours:  Shaking chills.  You have a temperature by mouth above 102 F (38.9 C), not controlled by medicine.  Chest, back, or muscle pain.  People around you feel you are not acting correctly or are confused.  Shortness of breath or difficulty breathing.  Dizziness  and fainting.  You get a rash or develop hives.  You have a decrease in urine output.  Your urine turns a dark color or changes to pink, red, or brown. Any of the following symptoms occur over the next 10 days:  You have a temperature by mouth above 102 F (38.9 C), not controlled by medicine.  Shortness of breath.  Weakness after normal activity.  The white part of the eye turns yellow (jaundice).  You have a decrease in the amount of urine or are urinating less often.  Your urine turns a dark color or changes to pink, red, or brown. Document Released: 11/13/2000 Document Revised: 02/08/2012 Document Reviewed: 07/02/2008 [facility] Patient Information 2014 [place], [place].  _______________________________________________________________________

## 2019-07-05 NOTE — Telephone Encounter (Signed)
I would use acyclovir ointment applied 5 times daily.  This would avoid a significant systemic exposure.

## 2019-07-05 NOTE — Telephone Encounter (Signed)
Patient was seen on 07/03/19 and prescribed generic valtrex 1000 mg tablet, patient picked up Rx and read side effects seizures. Patient is concerned because she has epilepsy and about 15 years ago when she took birth control pills she had a seizure. Patients asked any recommendations maybe a non oral medication? Please advise

## 2019-07-06 ENCOUNTER — Encounter (HOSPITAL_COMMUNITY)
Admission: RE | Admit: 2019-07-06 | Discharge: 2019-07-06 | Disposition: A | Discharge status: Home / Self Care | Payer: Medicare Other | Source: Ambulatory Visit | Attending: Orthopaedic Surgery | Admitting: Orthopaedic Surgery

## 2019-07-06 ENCOUNTER — Other Ambulatory Visit: Payer: Self-pay

## 2019-07-06 ENCOUNTER — Encounter (HOSPITAL_COMMUNITY): Payer: Self-pay

## 2019-07-06 ENCOUNTER — Ambulatory Visit (HOSPITAL_COMMUNITY)
Admission: RE | Admit: 2019-07-06 | Discharge: 2019-07-06 | Disposition: A | Discharge status: Home / Self Care | Payer: Medicare Other | Source: Ambulatory Visit | Attending: Orthopaedic Surgery | Admitting: Orthopaedic Surgery

## 2019-07-06 ENCOUNTER — Ambulatory Visit (HOSPITAL_COMMUNITY): Admission: RE | Admit: 2019-07-06 | Payer: Medicare Other | Source: Ambulatory Visit

## 2019-07-06 DIAGNOSIS — M13852 Other specified arthritis, left hip: Secondary | ICD-10-CM | POA: Insufficient documentation

## 2019-07-06 DIAGNOSIS — E039 Hypothyroidism, unspecified: Secondary | ICD-10-CM | POA: Diagnosis not present

## 2019-07-06 DIAGNOSIS — Z79890 Hormone replacement therapy (postmenopausal): Secondary | ICD-10-CM | POA: Insufficient documentation

## 2019-07-06 DIAGNOSIS — Z01818 Encounter for other preprocedural examination: Secondary | ICD-10-CM | POA: Diagnosis present

## 2019-07-06 DIAGNOSIS — Z79899 Other long term (current) drug therapy: Secondary | ICD-10-CM | POA: Diagnosis not present

## 2019-07-06 HISTORY — DX: Anogenital herpesviral infection, unspecified: A60.9

## 2019-07-06 HISTORY — DX: Unspecified osteoarthritis, unspecified site: M19.90

## 2019-07-06 HISTORY — DX: Hypotension, unspecified: I95.9

## 2019-07-06 LAB — URINALYSIS, ROUTINE W REFLEX MICROSCOPIC
Bilirubin Urine: NEGATIVE
Glucose, UA: NEGATIVE mg/dL
Hgb urine dipstick: NEGATIVE
Ketones, ur: NEGATIVE mg/dL
Leukocytes,Ua: NEGATIVE
Nitrite: NEGATIVE
Protein, ur: NEGATIVE mg/dL
Specific Gravity, Urine: 1.017 (ref 1.005–1.030)
pH: 5 (ref 5.0–8.0)

## 2019-07-06 LAB — BASIC METABOLIC PANEL
Anion gap: 8 (ref 5–15)
BUN: 17 mg/dL (ref 6–20)
CO2: 25 mmol/L (ref 22–32)
Calcium: 9.3 mg/dL (ref 8.9–10.3)
Chloride: 104 mmol/L (ref 98–111)
Creatinine, Ser: 0.79 mg/dL (ref 0.44–1.00)
GFR calc Af Amer: >60 mL/min (ref >60)
GFR calc non Af Amer: >60 mL/min (ref >60)
Glucose, Bld: 97 mg/dL (ref 70–99)
Potassium: 4.1 mmol/L (ref 3.5–5.1)
Sodium: 137 mmol/L (ref 135–145)

## 2019-07-06 LAB — CBC WITH DIFFERENTIAL/PLATELET
Abs Immature Granulocytes: 0.04 10*3/uL (ref 0.00–0.07)
Basophils Absolute: 0.1 10*3/uL (ref 0.0–0.1)
Basophils Relative: 1 %
Eosinophils Absolute: 0.1 10*3/uL (ref 0.0–0.5)
Eosinophils Relative: 1 %
HCT: 44.7 % (ref 36.0–46.0)
Hemoglobin: 14.4 g/dL (ref 12.0–15.0)
Immature Granulocytes: 1 %
Lymphocytes Relative: 32 %
Lymphs Abs: 2.1 10*3/uL (ref 0.7–4.0)
MCH: 30.5 pg (ref 26.0–34.0)
MCHC: 32.2 g/dL (ref 30.0–36.0)
MCV: 94.7 fL (ref 80.0–100.0)
Monocytes Absolute: 0.4 10*3/uL (ref 0.1–1.0)
Monocytes Relative: 7 %
Neutro Abs: 3.9 10*3/uL (ref 1.7–7.7)
Neutrophils Relative %: 58 %
Platelets: 244 10*3/uL (ref 150–400)
RBC: 4.72 MIL/uL (ref 3.87–5.11)
RDW: 12.9 % (ref 11.5–15.5)
WBC: 6.6 10*3/uL (ref 4.0–10.5)
nRBC: 0 % (ref 0.0–0.2)

## 2019-07-06 LAB — SURGICAL PCR SCREEN
MRSA, PCR: NEGATIVE
Staphylococcus aureus: NEGATIVE

## 2019-07-06 LAB — ABO/RH: ABO/RH(D): A POS

## 2019-07-06 LAB — APTT: aPTT: 29 seconds (ref 24–36)

## 2019-07-06 LAB — PROTIME-INR
INR: 0.9 (ref 0.8–1.2)
Prothrombin Time: 12.3 seconds (ref 11.4–15.2)

## 2019-07-06 NOTE — Progress Notes (Signed)
Medical clearance from Dr. Assunta Found received and place in chart.

## 2019-07-06 NOTE — Progress Notes (Signed)
SPOKE W/  Brooke     SCREENING SYMPTOMS OF COVID 19:   COUGH--NO  RUNNY NOSE--- NO  SORE THROAT---NO  NASAL CONGESTION----NO  SNEEZING----NO  SHORTNESS OF BREATH---NO  DIFFICULTY BREATHING---NO  TEMP >100.0 -----NO  UNEXPLAINED BODY ACHES------NO  CHILLS -------- NO  HEADACHES ---------NO  LOSS OF SMELL/ TASTE --------NO    HAVE YOU OR ANY FAMILY MEMBER TRAVELLED PAST 14 DAYS OUT OF THE   COUNTY---NO STATE----NO COUNTRY----NO  HAVE YOU OR ANY FAMILY MEMBER BEEN EXPOSED TO ANYONE WITH COVID 19? NO

## 2019-07-07 ENCOUNTER — Other Ambulatory Visit (HOSPITAL_COMMUNITY)
Admission: RE | Admit: 2019-07-07 | Discharge: 2019-07-07 | Disposition: A | Discharge status: Home / Self Care | Payer: Medicare Other | Source: Ambulatory Visit | Attending: Orthopaedic Surgery | Admitting: Orthopaedic Surgery

## 2019-07-07 DIAGNOSIS — Z20828 Contact with and (suspected) exposure to other viral communicable diseases: Secondary | ICD-10-CM | POA: Diagnosis not present

## 2019-07-07 DIAGNOSIS — Z01812 Encounter for preprocedural laboratory examination: Secondary | ICD-10-CM | POA: Insufficient documentation

## 2019-07-07 LAB — SARS CORONAVIRUS 2 (TAT 6-24 HRS): SARS Coronavirus 2: NEGATIVE

## 2019-07-09 LAB — SURESWAB HSV, TYPE 1/2 DNA, PCR
HSV 1 DNA: NOT DETECTED
HSV 2 DNA: DETECTED — AB

## 2019-07-10 MED ORDER — BUPIVACAINE LIPOSOME 1.3 % IJ SUSP
10.0000 mL | Freq: Once | INTRAMUSCULAR | Status: DC
  Filled 2019-07-10: qty 10

## 2019-07-10 MED ORDER — TRANEXAMIC ACID 1000 MG/10ML IV SOLN
2000.0000 mg | INTRAVENOUS | Status: DC
  Filled 2019-07-10: qty 20

## 2019-07-10 NOTE — H&P (Signed)
TOTAL HIP ADMISSION H&P  Patient is admitted for left total hip arthroplasty.  Subjective:  Chief Complaint: left hip pain  HPI: [name], 48 y.o. female, has a history of pain and functional disability in the left hip(s) due to arthritis and patient has failed non-surgical conservative treatments for greater than 12 weeks to include NSAID's and/or analgesics, corticosteriod injections, flexibility and strengthening excercises, supervised PT with diminished ADL's post treatment, use of assistive devices, weight reduction as appropriate and activity modification.  Onset of symptoms was gradual starting 5 years ago with gradually worsening course since that time.The patient noted no past surgery on the left hip(s).  Patient currently rates pain in the left hip at 10 out of 10 with activity. Patient has night pain, worsening of pain with activity and weight bearing, trendelenberg gait, pain that interfers with activities of daily living, crepitus and joint swelling. Patient has evidence of subchondral cysts, subchondral sclerosis, periarticular osteophytes and joint space narrowing by imaging studies. This condition presents safety issues increasing the risk of falls. There is no current active infection.  Patient Active Problem List   Diagnosis Date Noted  . Seizure ([place])   . Closed head injury   . Hypothyroid    Past Medical History:  Diagnosis Date  . Arthritis   . Closed head injury 1995  . Endometriosis 2008  . Headache(784.0)   . HSV (herpes simplex virus) anogenital infection   . Hypothyroid   . 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 Low blood pressure   . 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 Seizure ([place])    controlled with med  . Short-term memory loss     Past Surgical History:  Procedure Laterality Date  . ABLATION ON ENDOMETRIOSIS N/A 02/13/2013   Procedure: Incision  andABLATION ON ENDOMETRIOSIS;  Surgeon: Anastasio Auerbach, MD;  Location: [facility];  Service: Gynecology;  Laterality: N/A;  Laparoscopic Ablation of Endometriosis with Peritoneal Biopsy  . HYSTEROSCOPY  2008  . LAPAROSCOPIC ASSISTED VAGINAL HYSTERECTOMY N/A 02/13/2013   Procedure: LAPAROSCOPIC ASSISTED VAGINAL HYSTERECTOMY;  Surgeon: Anastasio Auerbach, MD;  Location: [facility];  Service: Gynecology;  Laterality: N/A;  . OOPHORECTOMY  01/2008   LAP LSO, ENDO [place]  . PELVIC LAPAROSCOPY  2008  . WISDOM TOOTH EXTRACTION      Current Facility-Administered Medications  Medication Dose Route Frequency Provider Last Rate Last Dose  . [START ON 07/11/2019] bupivacaine liposome (EXPAREL) 1.3 % injection 133 mg  10 mL Other Once Melrose Nakayama, MD      . Derrill Memo ON 07/11/2019] tranexamic acid (CYKLOKAPRON) 2,000 mg in sodium chloride 0.9 % 50 mL Topical Application  1,610 mg Topical To OR Melrose Nakayama, MD       Current Outpatient Medications  Medication Sig Dispense Refill Last Dose  . acetaZOLAMIDE (DIAMOX) 250 MG tablet Take 125 mg by mouth as needed.      . ALPRAZolam (XANAX) 0.5 MG tablet Take 0.5 mg by mouth daily as needed for anxiety.      . cholecalciferol (VITAMIN D3) 25 MCG (1000 UT) tablet Take 1,000 Units by mouth daily.     Marland Kitchen ibuprofen (ADVIL,MOTRIN) 800 MG tablet Take 1 tablet (800 mg total) by mouth every 8 (eight) hours as needed. (Patient taking differently: Take 800 mg by mouth every 8 (eight) hours as needed for moderate pain. ) 30 tablet 3   .  LamoTRIgine (LAMICTAL XR) 300 MG TB24 24 hour tablet Take 300 mg by mouth 2 (two) times daily.     Marland Kitchen levETIRAcetam (KEPPRA XR) 500 MG 24 hr tablet Take 1,000 mg by mouth 2 (two) times daily.      Marland Kitchen levothyroxine (SYNTHROID, LEVOTHROID) 125 MCG tablet Take 125 mcg by mouth daily.     . Multiple Vitamin (MULTIVITAMIN WITH MINERALS) TABS Take 1 tablet by mouth daily.     . Oxcarbazepine (TRILEPTAL) 300 MG tablet Take 300 mg by mouth 2 (two)  times daily.      . vitamin B-12 (CYANOCOBALAMIN) 100 MCG tablet Take 100 mcg by mouth daily.     Marland Kitchen acyclovir ointment (ZOVIRAX) 5 % Apply 1 application topically 5 (five) times daily as needed. 30 g 0    Allergies  Allergen Reactions  . Other Anaphylaxis    ALLERGIC TO ORAL CONTRACEPTIVES---SEIZURE.  Marland Kitchen Oxycodone     SEIZURE, but pt says she can take vicoden    Social History   Tobacco Use  . Smoking status: Never Smoker  . Smokeless tobacco: Never Used  Substance Use Topics  . Alcohol use: Yes    Alcohol/week: 0.0 standard drinks    Comment: very little    Family History  Problem Relation Age of Onset  . Endometriosis Mother      Review of Systems  Musculoskeletal: Positive for joint pain.       Left hip  All other systems reviewed and are negative.   Objective:  Physical Exam  Constitutional: She is oriented to person, place, and time. She appears well-developed and well-nourished.  HENT:  Head: Normocephalic and atraumatic.  Eyes: Pupils are equal, round, and reactive to light.  Neck: Normal range of motion.  Cardiovascular: Normal rate and regular rhythm.  Respiratory: Effort normal.  GI: Soft.  Musculoskeletal:     Comments: Examination of the left hip shows significantly limited range of motion with pain with internal range of motion.  Leg lengths appear roughly equal.  She does have 5 of 5 strength normal sensation throughout both lower extremities.  No tenderness to palpation about the hip.  She is neurovascularly intact distally.    Neurological: She is alert and oriented to person, place, and time.  Skin: Skin is warm and dry.  Psychiatric: She has a normal mood and affect. Her behavior is normal. Judgment and thought content normal.    Vital signs in last 24 hours:    Labs:   Estimated body mass index is 23.81 kg/m (pended) as calculated from the following:   Height as of 07/06/19: (P) 5\' 7"  (1.702 m).   Weight as of 10/12/16: 68.9 kg.   Imaging  Review Plain radiographs demonstrate severe degenerative joint disease of the left hip(s). The bone quality appears to be good for age and reported activity level.      Assessment/Plan:  End stage post traumatic arthritis, left hip(s)  The patient history, physical examination, clinical judgement of the provider and imaging studies are consistent with end stage degenerative joint disease of the left hip(s) and total hip arthroplasty is deemed medically necessary. The treatment options including medical management, injection therapy, arthroscopy and arthroplasty were discussed at length. The risks and benefits of total hip arthroplasty were presented and reviewed. The risks due to aseptic loosening, infection, stiffness, dislocation/subluxation,  thromboembolic complications and other imponderables were discussed.  The patient acknowledged the explanation, agreed to proceed with the plan and consent was signed. Patient is being  admitted for inpatient treatment for surgery, pain control, PT, OT, prophylactic antibiotics, VTE prophylaxis, progressive ambulation and ADL's and discharge planning.The patient is planning to be discharged home with home health services

## 2019-07-11 ENCOUNTER — Encounter (HOSPITAL_COMMUNITY)
Admission: RE | Disposition: A | Discharge status: Home / Self Care | Payer: Self-pay | Source: Other Acute Inpatient Hospital | Attending: Orthopaedic Surgery

## 2019-07-11 ENCOUNTER — Observation Stay (HOSPITAL_COMMUNITY)
Admission: RE | Admit: 2019-07-11 | Discharge: 2019-07-13 | Disposition: A | Discharge status: Home / Self Care | Payer: Medicare Other | Source: Other Acute Inpatient Hospital | Attending: Orthopaedic Surgery | Admitting: Orthopaedic Surgery

## 2019-07-11 ENCOUNTER — Encounter (HOSPITAL_COMMUNITY): Payer: Self-pay | Admitting: Anesthesiology

## 2019-07-11 ENCOUNTER — Ambulatory Visit (HOSPITAL_COMMUNITY): Payer: Medicare Other | Admitting: Anesthesiology

## 2019-07-11 ENCOUNTER — Ambulatory Visit (HOSPITAL_COMMUNITY): Payer: Medicare Other

## 2019-07-11 ENCOUNTER — Ambulatory Visit (HOSPITAL_COMMUNITY): Payer: Medicare Other | Admitting: Physician Assistant

## 2019-07-11 DIAGNOSIS — Z87820 Personal history of traumatic brain injury: Secondary | ICD-10-CM | POA: Diagnosis not present

## 2019-07-11 DIAGNOSIS — E039 Hypothyroidism, unspecified: Secondary | ICD-10-CM | POA: Diagnosis not present

## 2019-07-11 DIAGNOSIS — T50905A Adverse effect of unspecified drugs, medicaments and biological substances, initial encounter: Secondary | ICD-10-CM | POA: Insufficient documentation

## 2019-07-11 DIAGNOSIS — R569 Unspecified convulsions: Secondary | ICD-10-CM | POA: Diagnosis not present

## 2019-07-11 DIAGNOSIS — Z79890 Hormone replacement therapy (postmenopausal): Secondary | ICD-10-CM | POA: Insufficient documentation

## 2019-07-11 DIAGNOSIS — Z79899 Other long term (current) drug therapy: Secondary | ICD-10-CM | POA: Insufficient documentation

## 2019-07-11 DIAGNOSIS — Z85828 Personal history of other malignant neoplasm of skin: Secondary | ICD-10-CM | POA: Insufficient documentation

## 2019-07-11 DIAGNOSIS — M1612 Unilateral primary osteoarthritis, left hip: Principal | ICD-10-CM | POA: Diagnosis present

## 2019-07-11 DIAGNOSIS — Z419 Encounter for procedure for purposes other than remedying health state, unspecified: Secondary | ICD-10-CM

## 2019-07-11 DIAGNOSIS — R42 Dizziness and giddiness: Secondary | ICD-10-CM | POA: Diagnosis not present

## 2019-07-11 HISTORY — PX: TOTAL HIP ARTHROPLASTY: SHX124

## 2019-07-11 LAB — TYPE AND SCREEN
ABO/RH(D): A POS
Antibody Screen: NEGATIVE

## 2019-07-11 SURGERY — ARTHROPLASTY, HIP, TOTAL, ANTERIOR APPROACH
Anesthesia: Spinal | Site: Hip | Laterality: Left

## 2019-07-11 MED ORDER — TRANEXAMIC ACID 1000 MG/10ML IV SOLN
INTRAVENOUS | Status: DC | PRN
  Administered 2019-07-11: 2000 mg via TOPICAL

## 2019-07-11 MED ORDER — TRANEXAMIC ACID-NACL 1000-0.7 MG/100ML-% IV SOLN
INTRAVENOUS | Status: AC
  Filled 2019-07-11: qty 100

## 2019-07-11 MED ORDER — MORPHINE SULFATE (PF) 4 MG/ML IV SOLN
0.5000 mg | INTRAVENOUS | Status: DC | PRN
  Administered 2019-07-11: 2 mg via INTRAVENOUS
  Administered 2019-07-11: 20:00:00 1 mg via INTRAVENOUS
  Administered 2019-07-12 (×2): 2 mg via INTRAVENOUS
  Filled 2019-07-11 (×3): qty 1

## 2019-07-11 MED ORDER — KETOROLAC TROMETHAMINE 15 MG/ML IJ SOLN
INTRAMUSCULAR | Status: AC
  Administered 2019-07-12: 15 mg
  Filled 2019-07-11: qty 1

## 2019-07-11 MED ORDER — ASPIRIN 81 MG PO CHEW
81.0000 mg | CHEWABLE_TABLET | Freq: Two times a day (BID) | ORAL | Status: DC
  Administered 2019-07-11 – 2019-07-13 (×4): 81 mg via ORAL
  Filled 2019-07-11 (×4): qty 1

## 2019-07-11 MED ORDER — METHOCARBAMOL 500 MG PO TABS: 500.0000 mg | ORAL_TABLET | Freq: Four times a day (QID) | ORAL | Status: DC | PRN

## 2019-07-11 MED ORDER — LEVETIRACETAM ER 500 MG PO TB24
1000.0000 mg | ORAL_TABLET | Freq: Two times a day (BID) | ORAL | Status: DC
  Administered 2019-07-11 – 2019-07-13 (×4): 1000 mg via ORAL
  Filled 2019-07-11 (×5): qty 2

## 2019-07-11 MED ORDER — DIPHENHYDRAMINE HCL 12.5 MG/5ML PO ELIX: 12.5000 mg | ORAL_SOLUTION | ORAL | Status: DC | PRN

## 2019-07-11 MED ORDER — BUPIVACAINE LIPOSOME 1.3 % IJ SUSP
INTRAMUSCULAR | Status: DC | PRN
  Administered 2019-07-11: 10 mL

## 2019-07-11 MED ORDER — LACTATED RINGERS IV SOLN
INTRAVENOUS | Status: DC
  Administered 2019-07-11: 22:00:00 via INTRAVENOUS

## 2019-07-11 MED ORDER — SODIUM CHLORIDE 0.9 % IV SOLN
INTRAVENOUS | Status: DC | PRN
  Administered 2019-07-11: 50 ug/min via INTRAVENOUS

## 2019-07-11 MED ORDER — MENTHOL 3 MG MT LOZG: 1.0000 | LOZENGE | OROMUCOSAL | Status: DC | PRN

## 2019-07-11 MED ORDER — ONDANSETRON HCL 4 MG/2ML IJ SOLN: 4.0000 mg | Freq: Four times a day (QID) | INTRAMUSCULAR | Status: DC | PRN

## 2019-07-11 MED ORDER — 0.9 % SODIUM CHLORIDE (POUR BTL) OPTIME
TOPICAL | Status: DC | PRN
  Administered 2019-07-11: 1000 mL

## 2019-07-11 MED ORDER — CEFAZOLIN SODIUM-DEXTROSE 2-4 GM/100ML-% IV SOLN
2.0000 g | INTRAVENOUS | Status: AC
  Administered 2019-07-11: 2 g via INTRAVENOUS

## 2019-07-11 MED ORDER — MEPERIDINE HCL 50 MG/ML IJ SOLN: 6.2500 mg | INTRAMUSCULAR | Status: DC | PRN

## 2019-07-11 MED ORDER — PROPOFOL 10 MG/ML IV BOLUS
INTRAVENOUS | Status: AC
  Filled 2019-07-11: qty 20

## 2019-07-11 MED ORDER — LACTATED RINGERS IV SOLN
INTRAVENOUS | Status: DC
  Administered 2019-07-11 (×2): via INTRAVENOUS

## 2019-07-11 MED ORDER — MORPHINE SULFATE (PF) 4 MG/ML IV SOLN
INTRAVENOUS | Status: AC
  Administered 2019-07-11: 1 mg via INTRAVENOUS
  Filled 2019-07-11: qty 1

## 2019-07-11 MED ORDER — ONDANSETRON HCL 4 MG PO TABS: 4.0000 mg | ORAL_TABLET | Freq: Four times a day (QID) | ORAL | Status: DC | PRN

## 2019-07-11 MED ORDER — FENTANYL CITRATE (PF) 100 MCG/2ML IJ SOLN
INTRAMUSCULAR | Status: AC
  Filled 2019-07-11: qty 2

## 2019-07-11 MED ORDER — METOCLOPRAMIDE HCL 5 MG PO TABS: 5.0000 mg | ORAL_TABLET | Freq: Three times a day (TID) | ORAL | Status: DC | PRN

## 2019-07-11 MED ORDER — TRANEXAMIC ACID-NACL 1000-0.7 MG/100ML-% IV SOLN
1000.0000 mg | Freq: Once | INTRAVENOUS | Status: AC
  Administered 2019-07-11: 1000 mg via INTRAVENOUS
  Filled 2019-07-11: qty 100

## 2019-07-11 MED ORDER — OXYCODONE HCL 5 MG PO TABS: 5.0000 mg | ORAL_TABLET | Freq: Once | ORAL | Status: DC | PRN

## 2019-07-11 MED ORDER — BUPIVACAINE IN DEXTROSE 0.75-8.25 % IT SOLN
INTRATHECAL | Status: DC | PRN
  Administered 2019-07-11: 2 mL via INTRATHECAL

## 2019-07-11 MED ORDER — MIDAZOLAM HCL 2 MG/2ML IJ SOLN
INTRAMUSCULAR | Status: AC
  Filled 2019-07-11: qty 2

## 2019-07-11 MED ORDER — ACETAMINOPHEN 325 MG PO TABS: 325.0000 mg | ORAL_TABLET | ORAL | Status: DC | PRN

## 2019-07-11 MED ORDER — ONDANSETRON HCL 4 MG/2ML IJ SOLN: 4.0000 mg | Freq: Once | INTRAMUSCULAR | Status: DC | PRN

## 2019-07-11 MED ORDER — ACETAMINOPHEN 500 MG PO TABS
500.0000 mg | ORAL_TABLET | Freq: Four times a day (QID) | ORAL | Status: AC
  Administered 2019-07-11 – 2019-07-12 (×4): 500 mg via ORAL
  Filled 2019-07-11 (×4): qty 1

## 2019-07-11 MED ORDER — PHENYLEPHRINE HCL (PRESSORS) 10 MG/ML IV SOLN
INTRAVENOUS | Status: AC
  Filled 2019-07-11: qty 1

## 2019-07-11 MED ORDER — DOCUSATE SODIUM 100 MG PO CAPS
100.0000 mg | ORAL_CAPSULE | Freq: Two times a day (BID) | ORAL | Status: DC
  Filled 2019-07-11 (×4): qty 1

## 2019-07-11 MED ORDER — PROPOFOL 500 MG/50ML IV EMUL
INTRAVENOUS | Status: DC | PRN
  Administered 2019-07-11: 75 ug/kg/min via INTRAVENOUS

## 2019-07-11 MED ORDER — HYDROCODONE-ACETAMINOPHEN 5-325 MG PO TABS
1.0000 | ORAL_TABLET | ORAL | Status: DC | PRN
  Administered 2019-07-12 (×3): 1 via ORAL
  Filled 2019-07-11 (×3): qty 1

## 2019-07-11 MED ORDER — LACTATED RINGERS IV SOLN: INTRAVENOUS | Status: DC

## 2019-07-11 MED ORDER — PHENOL 1.4 % MT LIQD: 1.0000 | OROMUCOSAL | Status: DC | PRN

## 2019-07-11 MED ORDER — TRANEXAMIC ACID-NACL 1000-0.7 MG/100ML-% IV SOLN
1000.0000 mg | INTRAVENOUS | Status: AC
  Administered 2019-07-11: 1000 mg via INTRAVENOUS

## 2019-07-11 MED ORDER — ONDANSETRON HCL 4 MG/2ML IJ SOLN
INTRAMUSCULAR | Status: DC | PRN
  Administered 2019-07-11: 4 mg via INTRAVENOUS

## 2019-07-11 MED ORDER — OXYCODONE HCL 5 MG/5ML PO SOLN: 5.0000 mg | Freq: Once | ORAL | Status: DC | PRN

## 2019-07-11 MED ORDER — ACETAMINOPHEN 325 MG PO TABS
325.0000 mg | ORAL_TABLET | Freq: Four times a day (QID) | ORAL | Status: DC | PRN
  Administered 2019-07-12: 500 mg via ORAL

## 2019-07-11 MED ORDER — ALPRAZOLAM 0.5 MG PO TABS
0.5000 mg | ORAL_TABLET | Freq: Every day | ORAL | Status: DC | PRN
  Administered 2019-07-12 (×2): 0.5 mg via ORAL
  Filled 2019-07-11 (×2): qty 1

## 2019-07-11 MED ORDER — CHLORHEXIDINE GLUCONATE 4 % EX LIQD: 60.0000 mL | Freq: Once | CUTANEOUS | Status: DC

## 2019-07-11 MED ORDER — BISACODYL 5 MG PO TBEC: 5.0000 mg | DELAYED_RELEASE_TABLET | Freq: Every day | ORAL | Status: DC | PRN

## 2019-07-11 MED ORDER — BUPIVACAINE-EPINEPHRINE (PF) 0.25% -1:200000 IJ SOLN
INTRAMUSCULAR | Status: AC
  Filled 2019-07-11: qty 30

## 2019-07-11 MED ORDER — ACETAMINOPHEN 160 MG/5ML PO SOLN: 325.0000 mg | ORAL | Status: DC | PRN

## 2019-07-11 MED ORDER — CEFAZOLIN SODIUM-DEXTROSE 2-4 GM/100ML-% IV SOLN
INTRAVENOUS | Status: AC
  Filled 2019-07-11: qty 100

## 2019-07-11 MED ORDER — MORPHINE SULFATE (PF) 4 MG/ML IV SOLN: 0.5000 mg | INTRAVENOUS | Status: DC | PRN

## 2019-07-11 MED ORDER — OXCARBAZEPINE 300 MG PO TABS
300.0000 mg | ORAL_TABLET | Freq: Two times a day (BID) | ORAL | Status: DC
  Administered 2019-07-11 – 2019-07-13 (×4): 300 mg via ORAL
  Filled 2019-07-11 (×5): qty 1

## 2019-07-11 MED ORDER — KETOROLAC TROMETHAMINE 15 MG/ML IJ SOLN
15.0000 mg | Freq: Four times a day (QID) | INTRAMUSCULAR | Status: AC
  Administered 2019-07-11 – 2019-07-12 (×4): 15 mg via INTRAVENOUS
  Filled 2019-07-11 (×3): qty 1

## 2019-07-11 MED ORDER — FENTANYL CITRATE (PF) 100 MCG/2ML IJ SOLN: 25.0000 ug | INTRAMUSCULAR | Status: DC | PRN

## 2019-07-11 MED ORDER — METOCLOPRAMIDE HCL 5 MG/ML IJ SOLN: 5.0000 mg | Freq: Three times a day (TID) | INTRAMUSCULAR | Status: DC | PRN

## 2019-07-11 MED ORDER — MIDAZOLAM HCL 5 MG/5ML IJ SOLN
INTRAMUSCULAR | Status: DC | PRN
  Administered 2019-07-11: 2 mg via INTRAVENOUS

## 2019-07-11 MED ORDER — DEXAMETHASONE SODIUM PHOSPHATE 10 MG/ML IJ SOLN
INTRAMUSCULAR | Status: DC | PRN
  Administered 2019-07-11: 10 mg via INTRAVENOUS

## 2019-07-11 MED ORDER — LAMOTRIGINE ER 300 MG PO TB24
300.0000 mg | ORAL_TABLET | Freq: Two times a day (BID) | ORAL | Status: DC
  Administered 2019-07-11 – 2019-07-13 (×4): 300 mg via ORAL

## 2019-07-11 MED ORDER — LEVOTHYROXINE SODIUM 125 MCG PO TABS
125.0000 ug | ORAL_TABLET | Freq: Every day | ORAL | Status: DC
  Administered 2019-07-12 – 2019-07-13 (×2): 125 ug via ORAL
  Filled 2019-07-11 (×2): qty 1

## 2019-07-11 MED ORDER — POVIDONE-IODINE 10 % EX SWAB: 2.0000 "application " | Freq: Once | CUTANEOUS | Status: DC

## 2019-07-11 MED ORDER — METHOCARBAMOL 500 MG IVPB - SIMPLE MED
500.0000 mg | Freq: Four times a day (QID) | INTRAVENOUS | Status: DC | PRN
  Filled 2019-07-11: qty 50

## 2019-07-11 MED ORDER — ALUM & MAG HYDROXIDE-SIMETH 200-200-20 MG/5ML PO SUSP: 30.0000 mL | ORAL | Status: DC | PRN

## 2019-07-11 MED ORDER — PROPOFOL 10 MG/ML IV BOLUS
INTRAVENOUS | Status: AC
  Filled 2019-07-11: qty 60

## 2019-07-11 MED ORDER — HYDROCODONE-ACETAMINOPHEN 7.5-325 MG PO TABS
1.0000 | ORAL_TABLET | ORAL | Status: DC | PRN
  Administered 2019-07-13: 1 via ORAL
  Administered 2019-07-13: 2 via ORAL
  Administered 2019-07-13 (×2): 1 via ORAL
  Filled 2019-07-11 (×3): qty 1
  Filled 2019-07-11: qty 2

## 2019-07-11 MED ORDER — CEFAZOLIN SODIUM-DEXTROSE 2-4 GM/100ML-% IV SOLN
2.0000 g | Freq: Four times a day (QID) | INTRAVENOUS | Status: AC
  Administered 2019-07-11 – 2019-07-12 (×2): 2 g via INTRAVENOUS
  Filled 2019-07-11 (×2): qty 100

## 2019-07-11 SURGICAL SUPPLY — 43 items
BAG DECANTER FOR FLEXI CONT (MISCELLANEOUS) ×2 IMPLANT
BALL HIP CERAMIC (Hips) IMPLANT
BLADE SAW SGTL 18X1.27X75 (BLADE) ×2 IMPLANT
CELLS DAT CNTRL 66122 CELL SVR (MISCELLANEOUS) ×1 IMPLANT
COVER PERINEAL POST (MISCELLANEOUS) ×2 IMPLANT
COVER SURGICAL LIGHT HANDLE (MISCELLANEOUS) ×2 IMPLANT
COVER WAND RF STERILE (DRAPES) IMPLANT
DECANTER SPIKE VIAL GLASS SM (MISCELLANEOUS) ×2 IMPLANT
DRAPE IMP U-DRAPE 54X76 (DRAPES) ×2 IMPLANT
DRAPE STERI IOBAN 125X83 (DRAPES) ×2 IMPLANT
DRAPE U-SHAPE 47X51 STRL (DRAPES) ×4 IMPLANT
DRSG AQUACEL AG ADV 3.5X10 (GAUZE/BANDAGES/DRESSINGS) ×2 IMPLANT
DURAPREP 26ML APPLICATOR (WOUND CARE) ×2 IMPLANT
ELECT BLADE TIP CTD 4 INCH (ELECTRODE) ×2 IMPLANT
ELECT REM PT RETURN 15FT ADLT (MISCELLANEOUS) ×2 IMPLANT
ELIMINATOR HOLE APEX DEPUY (Hips) ×1 IMPLANT
GLOVE BIO SURGEON STRL SZ8 (GLOVE) ×4 IMPLANT
GLOVE BIOGEL PI IND STRL 8 (GLOVE) ×2 IMPLANT
GLOVE BIOGEL PI INDICATOR 8 (GLOVE) ×2
GOWN STRL REUS W/TWL XL LVL3 (GOWN DISPOSABLE) ×4 IMPLANT
HIP BALL CERAMIC (Hips) ×2 IMPLANT
HOLDER FOLEY CATH W/STRAP (MISCELLANEOUS) ×2 IMPLANT
KIT TURNOVER KIT A (KITS) IMPLANT
LINER ACETABULAR 32X50 (Liner) ×1 IMPLANT
LINER PINN ACET GRIP 50X100 ×1 IMPLANT
MANIFOLD NEPTUNE II (INSTRUMENTS) ×2 IMPLANT
NEEDLE HYPO 22GX1.5 SAFETY (NEEDLE) ×2 IMPLANT
NS IRRIG 1000ML POUR BTL (IV SOLUTION) ×2 IMPLANT
PACK ANTERIOR HIP CUSTOM (KITS) ×2 IMPLANT
PROTECTOR NERVE ULNAR (MISCELLANEOUS) ×2 IMPLANT
RETRACTOR WND ALEXIS 18 MED (MISCELLANEOUS) ×1 IMPLANT
RTRCTR WOUND ALEXIS 18CM MED (MISCELLANEOUS) ×2
STEM FEM ACTIS STD SZ2 (Stem) ×1 IMPLANT
SUT ETHIBOND NAB CT1 #1 30IN (SUTURE) ×4 IMPLANT
SUT VIC AB 1 CT1 36 (SUTURE) ×2 IMPLANT
SUT VIC AB 2-0 CT1 27 (SUTURE) ×2
SUT VIC AB 2-0 CT1 TAPERPNT 27 (SUTURE) ×1 IMPLANT
SUT VIC AB 3-0 PS2 18 (SUTURE) ×2
SUT VIC AB 3-0 PS2 18XBRD (SUTURE) ×1 IMPLANT
SUT VLOC 180 0 24IN GS25 (SUTURE) ×2 IMPLANT
SYR 50ML LL SCALE MARK (SYRINGE) ×2 IMPLANT
TRAY FOLEY MTR SLVR 16FR STAT (SET/KITS/TRAYS/PACK) ×2 IMPLANT
YANKAUER SUCT BULB TIP 10FT TU (MISCELLANEOUS) ×2 IMPLANT

## 2019-07-11 NOTE — Anesthesia Postprocedure Evaluation (Signed)
Anesthesia Post Note  Patient: [name]  Procedure(s) Performed: Left Anterior Hip Arthroplasty (Left Hip)     Patient location during evaluation: PACU Anesthesia Type: Spinal Level of consciousness: oriented and awake and alert Pain management: pain level controlled Vital Signs Assessment: post-procedure vital signs reviewed and stable Respiratory status: spontaneous breathing, respiratory function stable and patient connected to nasal cannula oxygen Cardiovascular status: blood pressure returned to baseline and stable Postop Assessment: no headache, no backache and no apparent nausea or vomiting Anesthetic complications: no    Last Vitals:  Vitals:   07/11/19 2017 07/11/19 2118  BP: 120/80 107/65  Pulse: 81 93  Resp:  18  Temp:  36.8 C  SpO2: 100% 100%    Last Pain:  Vitals:   07/11/19 2000  TempSrc:   PainSc: 5                  [place]

## 2019-07-11 NOTE — Interval H&P Note (Signed)
History and Physical Interval Note:  07/11/2019 2:15 PM  [name]  has presented today for surgery, with the diagnosis of Left Hip Degenerative Joint Disease.  The various methods of treatment have been discussed with the patient and family. After consideration of risks, benefits and other options for treatment, the patient has consented to  Procedure(s): Left Anterior Hip Arthroplasty (Left) as a surgical intervention.  The patient's history has been reviewed, patient examined, no change in status, stable for surgery.  I have reviewed the patient's chart and labs.  Questions were answered to the patient's satisfaction.     Hessie Dibble

## 2019-07-11 NOTE — Anesthesia Preprocedure Evaluation (Signed)
Anesthesia Evaluation  Patient identified by MRN, date of birth, ID band Patient awake    Airway Mallampati: I       Dental no notable dental hx. (+) Teeth Intact   Pulmonary neg pulmonary ROS,    Pulmonary exam normal        Cardiovascular negative cardio ROS Normal cardiovascular exam Rhythm:Regular Rate:Normal     Neuro/Psych PSYCHIATRIC DISORDERS Anxiety    GI/Hepatic negative GI ROS, Neg liver ROS,   Endo/Other  Hypothyroidism   Renal/GU   negative genitourinary   Musculoskeletal   Abdominal Normal abdominal exam  (+)   Peds  Hematology negative hematology ROS (+)   Anesthesia Other Findings   Reproductive/Obstetrics                             Anesthesia Physical Anesthesia Plan  ASA: II  Anesthesia Plan: Spinal   Post-op Pain Management:    Induction:   PONV Risk Score and Plan: 3 and Ondansetron, Dexamethasone and Midazolam  Airway Management Planned: Natural Airway, Nasal Cannula and Simple Face Mask  Additional Equipment:   Intra-op Plan:   Post-operative Plan:   Informed Consent:   Plan Discussed with:   Anesthesia Plan Comments:         Anesthesia Quick Evaluation

## 2019-07-11 NOTE — Op Note (Signed)
PRE-OP DIAGNOSIS:  LEFT HIP DEGENERATIVE JOINT DISEASE POST-OP DIAGNOSIS: same PROCEDURE:  LEFT TOTAL HIP ARTHROPLASTY ANTERIOR APPROACH ANESTHESIA:  Spinal and MAC SURGEON:  Melrose Nakayama MD ASSISTANT:  Loni Dolly PA-C   INDICATIONS FOR PROCEDURE:  The patient is a 48 y.o. female with a long history of a painful hip.  This has persisted despite multiple conservative measures.  The patient has persisted with pain and dysfunction making rest and activity difficult.  A total hip replacement is offered as surgical treatment.  Informed operative consent was obtained after discussion of possible complications including reaction to anesthesia, infection, neurovascular injury, dislocation, DVT, PE, and death.  The importance of the postoperative rehab program to optimize result was stressed with the patient.  SUMMARY OF FINDINGS AND PROCEDURE:  Under the above anesthesia through a anterior approach an the Hana table a left THR was performed.  The patient had severe degenerative change and excellent bone quality.  We used DePuy components to replace the hip and these were size 2 standard Actis femur capped with a +5 69m ceramic hip ball.  On the acetabular side we used a size 50 Gription shell with a plus 0 neutral polyethylene liner.  We did use a hole eliminator.  ALoni DollyPA-C assisted throughout and was invaluable to the completion of the case in that he helped position and retract while I performed the procedure.  He also closed simultaneously to help minimize OR time.  I used fluoroscopy throughout the case to check position of components and leg lengths and read all these views myself.  DESCRIPTION OF PROCEDURE:  The patient was taken to the OR suite where the above anesthetic was applied.  The patient was then positioned on the Hana table supine.  All bony prominences were appropriately padded.  Prep and drape was then performed in normal sterile fashion.  The patient was given kefzol preoperative  antibiotic and an appropriate time out was performed.  We then took an anterior approach to the left hip.  Dissection was taken through adipose to the tensor fascia lata fascia.  This structure was incised longitudinally and we dissected in the intermuscular interval just medial to this muscle.  Cobra retractors were placed superior and inferior to the femoral neck superficial to the capsule.  A capsular incision was then made and the retractors were placed along the femoral neck.  Xray was brought in to get a good level for the femoral neck cut which was made with an oscillating saw and osteotome.  The femoral head was removed with a corkscrew.  The acetabulum was exposed and some labral tissues were excised. Reaming was taken to the inside wall of the pelvis and sequentially up to 1 mm smaller than the actual component.  A trial of components was done and then the aforementioned acetabular shell was placed in appropriate tilt and anteversion confirmed by fluoroscopy. The liner was placed along with the hole eliminator and attention was turned to the femur.  The leg was brought down and over into adduction and the elevator bar was used to raise the femur up gently in the wound.  The piriformis was released with care taken to preserve the obturator internus attachment and all of the posterior capsule. The femur was reamed and then broached to the appropriate size.  A trial reduction was done and the aforementioned head and neck assembly gave uKoreathe best stability in extension with external rotation.  Leg lengths were felt to be about equal by  fluoroscopic exam.  The trial components were removed and the wound irrigated.  We then placed the femoral component in appropriate anteversion.  The head was applied to a dry stem neck and the hip again reduced.  It was again stable in the aforementioned position.  The would was irrigated again followed by re-approximation of anterior capsule with ethibond suture. Tensor  fascia was repaired with V-loc suture  followed by deep closure with #O and #2 undyed vicryl.  Skin was closed with subQ stitch and steristrips followed by a sterile dressing.  EBL and IOF can be obtained from anesthesia records.  DISPOSITION:  The patient was extubated in the OR and taken to PACU in stable condition to be admitted to the Orthopedic Surgery for appropriate post-op care to include perioperative antibiotics and DVT prophylaxis.

## 2019-07-11 NOTE — Anesthesia Procedure Notes (Signed)
Date/Time: 07/11/2019 2:21 PM Performed by: Glory Buff, CRNA Oxygen Delivery Method: Simple face mask

## 2019-07-11 NOTE — Anesthesia Procedure Notes (Addendum)
Spinal  Patient location during procedure: OR Start time: 07/11/2019 2:25 PM End time: 07/11/2019 2:30 PM Staffing Anesthesiologist: Janeece Riggers, MD Preanesthetic Checklist Completed: patient identified, site marked, surgical consent, pre-op evaluation, timeout performed, IV checked, risks and benefits discussed and monitors and equipment checked Spinal Block Patient position: sitting Prep: DuraPrep Patient monitoring: heart rate, cardiac monitor, continuous pulse ox and blood pressure Approach: midline Location: L3-4 Injection technique: single-shot Needle Needle type: Sprotte  Needle gauge: 24 G Needle length: 9 cm Assessment Sensory level: T4

## 2019-07-11 NOTE — Transfer of Care (Signed)
Immediate Anesthesia Transfer of Care Note  Patient: [name]  Procedure(s) Performed: Left Anterior Hip Arthroplasty (Left Hip)  Patient Location: PACU  Anesthesia Type:Spinal  Level of Consciousness: awake, alert  and oriented  Airway & Oxygen Therapy: Patient Spontanous Breathing and Patient connected to face mask oxygen  Post-op Assessment: Report given to RN and Post -op Vital signs reviewed and stable  Post vital signs: Reviewed and stable  Last Vitals:  Vitals Value Taken Time  BP 101/47 07/11/19 1616  Temp    Pulse 67 07/11/19 1619  Resp 14 07/11/19 1619  SpO2 100 % 07/11/19 1619  Vitals shown include unvalidated device data.  Last Pain:  Vitals:   07/11/19 1324  TempSrc:   PainSc: 0-No pain         Complications: No apparent anesthesia complications

## 2019-07-12 ENCOUNTER — Encounter (HOSPITAL_COMMUNITY): Payer: Self-pay | Admitting: Orthopaedic Surgery

## 2019-07-12 ENCOUNTER — Other Ambulatory Visit: Payer: Self-pay

## 2019-07-12 DIAGNOSIS — M1612 Unilateral primary osteoarthritis, left hip: Secondary | ICD-10-CM | POA: Diagnosis not present

## 2019-07-12 NOTE — Progress Notes (Signed)
Subjective: 1 Day Post-Op Procedure(s) (LRB): Left Anterior Hip Arthroplasty (Left)   Patient resting comfortably in bed this morning. 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 Her husband has called her neurologist to find out. She states she has just take tylenol and Ibu in the past for surgeries.  Activity level:  wbat Diet tolerance:  ok Voiding:  Foley out this morning. Patient reports pain as mild.    Objective: Vital signs in last 24 hours: Temp:  [97.2 F (36.2 C)-98.2 F (36.8 C)] 97.8 F (36.6 C) (08/12 0546) Pulse Rate:  [55-93] 71 (08/12 0546) Resp:  [12-18] 18 (08/12 0546) BP: (90-120)/(47-83) 110/76 (08/12 0546) SpO2:  [98 %-100 %] 98 % (08/12 0546) Weight:  [77.7 kg] 77.7 kg (08/11 1259)  Labs: No results for input(s): HGB in the last 72 hours. No results for input(s): WBC, RBC, HCT, PLT in the last 72 hours. No results for input(s): NA, K, CL, CO2, BUN, CREATININE, GLUCOSE, CALCIUM in the last 72 hours. No results for input(s): LABPT, INR in the last 72 hours.  Physical Exam:  Neurologically intact ABD soft Neurovascular intact Sensation intact distally Intact pulses distally Dorsiflexion/Plantar flexion intact Incision: dressing C/D/I and no drainage No cellulitis present Compartment soft  Assessment/Plan:  1 Day Post-Op Procedure(s) (LRB): Left Anterior Hip Arthroplasty (Left) Advance diet Up with therapy D/C IV fluids Discharge home with home health likely today after PT depending on how she does. I will call her mid day once we find out more about her pain medication and pain levels with PT. Continue on ASA 81mg  BID x 4 weeks for dvt prevention. Follow up in office 2 weeks post op.  Larwance Sachs Phoenyx Paulsen 07/12/2019, 7:31 AM

## 2019-07-12 NOTE — Plan of Care (Signed)
Plan of care reviewed and discussed with the patient and her husband. 

## 2019-07-12 NOTE — Progress Notes (Signed)
Physical Therapy Treatment Patient Details Name: [name] MRN: [id number] DOB: 01/28/1971 Today's Date: 07/12/2019    History of Present Illness Pt is a 48 year old female s/p Left DA THA    PT Comments    POD # 1 pm session Pt OOB in recliner.  Assisted with amb, bathroom and TE's.  General Comments: 25% impaired cognition requiring VC's safety.  General transfer comment: 25% VC's on safety and task completion.  Pt quick to share she had a head injury in her mid 20"s and still has some memory problems.  General Gait Details: 25% VC's on direction, proper walker to self distance and safety using walker throughout task.  In bathroom pt attempted to park walker to side and step 3 feet to toilet.  Redirected. Then returned to room to perform some TE's following HEP handout.  Instructed on proper tech, freq as well as use of ICE.   Pt stated she plans to D/C to home tomorrow AFTER 4 pm due to spouse working "out of town".   Follow Up Recommendations  Follow surgeon's recommendation for DC plan and follow-up therapies     Equipment Recommendations  Rolling walker with 5" wheels;3in1 (PT)    Recommendations for Other Services       Precautions / Restrictions Precautions Precautions: Fall Restrictions Weight Bearing Restrictions: No Other Position/Activity Restrictions: WBAT    Mobility  Bed Mobility               General bed mobility comments: OOB in recliner  Transfers Overall transfer level: Needs assistance Equipment used: Rolling walker (2 wheeled) Transfers: Sit to/from [facility] Sit to Stand: Supervision Stand pivot transfers: Supervision;Min guard       General transfer comment: 25% VC's on safety and task completion.  Pt quick to share she had a head injury in her mid 20"s and still has some memory problems  Ambulation/Gait Ambulation/Gait assistance: Min guard;Supervision Gait Distance (Feet): 65 Feet Assistive device:  Rolling walker (2 wheeled) Gait Pattern/deviations: Step-to pattern;Decreased stance time - left;Antalgic Gait velocity: decreased   General Gait Details: 25% VC's on direction, proper walker to self distance and safety using walker throughout task.  In bathroom pt attempted to park walker to side and step 3 feet to toilet.  Redirected.   Stairs             Wheelchair Mobility    Modified Rankin (Stroke Patients Only)       Balance                                            Cognition Arousal/Alertness: Awake/alert                                     General Comments: 25% impaired cognition requiring VC's safety      Exercises   Total Hip Replacement TE's 10 reps ankle pumps 10 reps knee presses 10 reps heel slides 10 reps SAQ's 10 reps ABD Followed by ICE     General Comments        Pertinent Vitals/Pain Pain Assessment: 0-10 Pain Score: 7  Pain Location: L hip Pain Descriptors / Indicators: Grimacing;Tender Pain Intervention(s): Repositioned;Monitored during session;Ice applied    Home Living  Prior Function            PT Goals (current goals can now be found in the care plan section) Progress towards PT goals: Progressing toward goals    Frequency    7X/week      PT Plan Current plan remains appropriate    Co-evaluation              AM-PAC PT "6 Clicks" Mobility   Outcome Measure  Help needed turning from your back to your side while in a flat bed without using bedrails?: A Little Help needed moving from lying on your back to sitting on the side of a flat bed without using bedrails?: A Little Help needed moving to and from a bed to a chair (including a wheelchair)?: A Little Help needed standing up from a chair using your arms (e.g., wheelchair or bedside chair)?: A Little Help needed to walk in hospital room?: A Little Help needed climbing 3-5 steps with a  railing? : A Little 6 Click Score: 18    End of Session Equipment Utilized During Treatment: Gait belt Activity Tolerance: Patient tolerated treatment well Patient left: in chair;with chair alarm set;with call bell/phone within reach   PT Visit Diagnosis: Other abnormalities of gait and mobility (R26.89);Pain Pain - Right/Left: Left Pain - part of body: Hip     Time: 1417-1440 PT Time Calculation (min) (ACUTE ONLY): 23 min  Charges:  $Gait Training: 8-22 mins $Therapeutic Exercise: 8-22 mins                     Rica Koyanagi  PTA Acute  Rehabilitation Services Pager      [phone] Office      [phone]

## 2019-07-12 NOTE — TOC Progression Note (Signed)
Transition of Care [facility]) - Progression Note    Patient Details  Name: JAYLA MACKIE MRN: [id number] Date of Birth: [birth year]  Transition of Care [facility]) CM/SW [place], LCSW Phone Number: 07/12/2019, 10:50 AM  Clinical Narrative:    Therapy Plan: [place] arranged with patient in office DME - RW and 3 in 1 ordered through [place] and delivered to the patient room.      Expected Discharge Plan: [place] Barriers to Discharge: No Barriers Identified  Expected Discharge Plan and Services Expected Discharge Plan: [place]         Expected Discharge Date: 07/12/19               DME Arranged: 3-N-1, Walker rolling DME Agency: [employer] Date DME Agency Contacted: 07/12/19 Time DME Agency Contacted: [id number] Representative spoke with at DME Agency: [place]: [facility] Date [place]: 07/12/19 Time [place]: 29 Representative spoke with at [place]: [place] ([place]) Interventions    Readmission Risk Interventions No flowsheet data found.

## 2019-07-12 NOTE — Evaluation (Signed)
Physical Therapy Evaluation Patient Details Name: [name] MRN: [id number] DOB: 13-May-1971 Today's Date: 07/12/2019   History of Present Illness  Pt is a 48 year old female s/p Left DA THA  Clinical Impression  Pt is s/p THA resulting in the deficits listed below (see PT Problem List).  Pt will benefit from skilled PT to increase their independence and safety with mobility to allow discharge to the venue listed below.  Pt assisted with ambulating short distance x2 however pain limiting.  Pt and spouse attempting to figure out pain regimen.  Pt would benefit from another session prior to d/c (possibly later today).     Follow Up Recommendations Follow surgeon's recommendation for DC plan and follow-up therapies    Equipment Recommendations  Rolling walker with 5" wheels    Recommendations for Other Services       Precautions / Restrictions Precautions Precautions: Fall Restrictions Other Position/Activity Restrictions: WBAT      Mobility  Bed Mobility Overal bed mobility: Needs Assistance Bed Mobility: Supine to Sit     Supine to sit: Min assist     General bed mobility comments: assist for left LE  Transfers Overall transfer level: Needs assistance Equipment used: Rolling walker (2 wheeled) Transfers: Sit to/from Stand Sit to Stand: Min guard         General transfer comment: verbal cues for UE and LE positioning for pain control, min/guard for safety  Ambulation/Gait Ambulation/Gait assistance: Min guard;Min assist Gait Distance (Feet): 40 Feet Assistive device: Rolling walker (2 wheeled) Gait Pattern/deviations: Step-to pattern;Decreased stance time - left;Antalgic     General Gait Details: verbal cues for sequence, RW positioning, step length, posture; initially min assist due to pain and limited distance so sat in recliner; pt then requested to use restroom and pain improved so ambulated in hallway however then stabbing pain (anterior mid  thigh) limited  Stairs            Wheelchair Mobility    Modified Rankin (Stroke Patients Only)       Balance                                             Pertinent Vitals/Pain Pain Assessment: 0-10 Pain Score: 10-Worst pain ever Pain Location: L hip with decreased with ambulation, anterior mid thigh stabbing pain which increased with ambulation Pain Descriptors / Indicators: Sore;Sharp;Stabbing Pain Intervention(s): Patient requesting pain meds-RN notified;Repositioned;Monitored during session;Limited activity within patient's tolerance    Home Living Family/patient expects to be discharged to:: Private residence Living Arrangements: Spouse/significant other   Type of Home: House Home Access: Stairs to enter   Technical brewer of Steps: 1 Home Layout: Able to live on main level with bedroom/bathroom Home Equipment: None      Prior Function Level of Independence: Independent               Hand Dominance        Extremity/Trunk Assessment        Lower Extremity Assessment Lower Extremity Assessment: LLE deficits/detail LLE Deficits / Details: anticipated post op hip pain, grossly 2+/5 hip       Communication   Communication: No difficulties  Cognition Arousal/Alertness: Awake/alert Behavior During Therapy: WFL for tasks assessed/performed Overall Cognitive Status: Within Functional Limits for tasks assessed  General Comments      Exercises     Assessment/Plan    PT Assessment Patient needs continued PT services  PT Problem List Decreased strength;Decreased mobility;Decreased activity tolerance;Decreased knowledge of use of DME;Pain;Decreased range of motion       PT Treatment Interventions DME instruction;Therapeutic exercise;Gait training;Stair training;Functional mobility training;Therapeutic activities;Patient/family education    PT Goals (Current goals  can be found in the Care Plan section)  Acute Rehab PT Goals PT Goal Formulation: With patient Time For Goal Achievement: 07/19/19 Potential to Achieve Goals: Good    Frequency 7X/week   Barriers to discharge        Co-evaluation               AM-PAC PT "6 Clicks" Mobility  Outcome Measure Help needed turning from your back to your side while in a flat bed without using bedrails?: A Little Help needed moving from lying on your back to sitting on the side of a flat bed without using bedrails?: A Little Help needed moving to and from a bed to a chair (including a wheelchair)?: A Little Help needed standing up from a chair using your arms (e.g., wheelchair or bedside chair)?: A Little Help needed to walk in hospital room?: A Little Help needed climbing 3-5 steps with a railing? : A Little 6 Click Score: 18    End of Session Equipment Utilized During Treatment: Gait belt Activity Tolerance: Patient limited by pain Patient left: in chair;with chair alarm set;with call bell/phone within reach   PT Visit Diagnosis: Other abnormalities of gait and mobility (R26.89);Pain Pain - Right/Left: Left Pain - part of body: Hip    Time: 0912-0934 PT Time Calculation (min) (ACUTE ONLY): 22 min   Charges:   PT Evaluation $PT Eval Low Complexity: [place], PT, DPT Acute Rehabilitation Services Office: [phone] Pager: [phone]   Trena Platt 07/12/2019, 10:51 AM

## 2019-07-13 DIAGNOSIS — M1612 Unilateral primary osteoarthritis, left hip: Secondary | ICD-10-CM | POA: Diagnosis not present

## 2019-07-13 LAB — BASIC METABOLIC PANEL
Anion gap: 8 (ref 5–15)
BUN: 9 mg/dL (ref 6–20)
CO2: 28 mmol/L (ref 22–32)
Calcium: 8.1 mg/dL — ABNORMAL LOW (ref 8.9–10.3)
Chloride: 104 mmol/L (ref 98–111)
Creatinine, Ser: 0.63 mg/dL (ref 0.44–1.00)
GFR calc Af Amer: >60 mL/min (ref >60)
GFR calc non Af Amer: >60 mL/min (ref >60)
Glucose, Bld: 109 mg/dL — ABNORMAL HIGH (ref 70–99)
Potassium: 3.3 mmol/L — ABNORMAL LOW (ref 3.5–5.1)
Sodium: 140 mmol/L (ref 135–145)

## 2019-07-13 LAB — CBC
HCT: 33.3 % — ABNORMAL LOW (ref 36.0–46.0)
Hemoglobin: 10.7 g/dL — ABNORMAL LOW (ref 12.0–15.0)
MCH: 31.1 pg (ref 26.0–34.0)
MCHC: 32.1 g/dL (ref 30.0–36.0)
MCV: 96.8 fL (ref 80.0–100.0)
Platelets: 179 10*3/uL (ref 150–400)
RBC: 3.44 MIL/uL — ABNORMAL LOW (ref 3.87–5.11)
RDW: 13.2 % (ref 11.5–15.5)
WBC: 10 10*3/uL (ref 4.0–10.5)
nRBC: 0 % (ref 0.0–0.2)

## 2019-07-13 MED ORDER — ASPIRIN 81 MG PO CHEW: 81.0000 mg | CHEWABLE_TABLET | Freq: Two times a day (BID) | ORAL | 0 refills | Status: DC

## 2019-07-13 MED ORDER — TIZANIDINE HCL 4 MG PO TABS: 4.0000 mg | ORAL_TABLET | Freq: Four times a day (QID) | ORAL | 1 refills | Status: DC | PRN

## 2019-07-13 MED ORDER — SODIUM CHLORIDE 0.9 % IV BOLUS
500.0000 mL | Freq: Once | INTRAVENOUS | Status: AC
  Administered 2019-07-13: 10:00:00 via INTRAVENOUS

## 2019-07-13 MED ORDER — HYDROCODONE-ACETAMINOPHEN 5-325 MG PO TABS: 1.0000 | ORAL_TABLET | Freq: Four times a day (QID) | ORAL | 0 refills | Status: DC | PRN

## 2019-07-13 NOTE — Progress Notes (Signed)
Physical Therapy Treatment Patient Details Name: [name] MRN: [id number] DOB: 05-23-1971 Today's Date: 07/13/2019    History of Present Illness Pt is a 48 year old female s/p Left DA THA    PT Comments    POD # 2 pm session Assisted with amb and practiced stairs.  Addressed all mobility questions, discussed appropriate activity, educated on use of ICE.  Pt ready for D/C to home.   Follow Up Recommendations  Follow surgeon's recommendation for DC plan and follow-up therapies     Equipment Recommendations  Rolling walker with 5" wheels;3in1 (PT)    Recommendations for Other Services       Precautions / Restrictions Precautions Precautions: Fall Restrictions Weight Bearing Restrictions: No Other Position/Activity Restrictions: WBAT    Mobility  Bed Mobility Overal bed mobility: Needs Assistance Bed Mobility: Supine to Sit     Supine to sit: Min guard;Supervision     General bed mobility comments: demonstared and instructed how to use belt loop to self assist L LE off bed.  required increased time  Transfers Overall transfer level: Needs assistance Equipment used: Rolling walker (2 wheeled) Transfers: Sit to/from [facility] Sit to Stand: Supervision Stand pivot transfers: Supervision;Min guard       General transfer comment: 25% VC's on safety and increased time  Ambulation/Gait Ambulation/Gait assistance: Min guard;Supervision Gait Distance (Feet): 85 Feet Assistive device: Rolling walker (2 wheeled) Gait Pattern/deviations: Step-to pattern;Decreased stance time - left;Antalgic Gait velocity: decreased   General Gait Details: increased time and 25% VC's on safety as pt tends to get distrated due to pain and emotional state.   Stairs Stairs: Yes Stairs assistance: Min assist Stair Management: No rails;Forwards;With walker Number of Stairs: 2 General stair comments: 50% VC's on proper walker placement and proper  sequencing   Wheelchair Mobility    Modified Rankin (Stroke Patients Only)       Balance                                            Cognition Arousal/Alertness: Awake/alert Behavior During Therapy: WFL for tasks assessed/performed Overall Cognitive Status: Within Functional Limits for tasks assessed                                 General Comments: emotional      Exercises      General Comments        Pertinent Vitals/Pain Pain Assessment: 0-10 Pain Score: 9  Pain Location: L hip Pain Descriptors / Indicators: Grimacing;Tender;Crying Pain Intervention(s): Monitored during session;Premedicated before session;Repositioned;Ice applied    Home Living                      Prior Function            PT Goals (current goals can now be found in the care plan section) Progress towards PT goals: Progressing toward goals    Frequency    7X/week      PT Plan Current plan remains appropriate    Co-evaluation              AM-PAC PT "6 Clicks" Mobility   Outcome Measure  Help needed turning from your back to your side while in a flat bed without using bedrails?: A Little Help needed moving from lying  on your back to sitting on the side of a flat bed without using bedrails?: A Little Help needed moving to and from a bed to a chair (including a wheelchair)?: A Little Help needed standing up from a chair using your arms (e.g., wheelchair or bedside chair)?: A Little Help needed to walk in hospital room?: A Little Help needed climbing 3-5 steps with a railing? : A Little 6 Click Score: 18    End of Session Equipment Utilized During Treatment: Gait belt Activity Tolerance: Patient tolerated treatment well Patient left: in chair;with chair alarm set;with call bell/phone within reach   PT Visit Diagnosis: Other abnormalities of gait and mobility (R26.89);Pain Pain - Right/Left: Left Pain - part of body: Hip      Time: 0630-1601 PT Time Calculation (min) (ACUTE ONLY): 25 min  Charges:  $Gait Training: 8-22 mins $Therapeutic Activity: 8-22 mins                     {Gust Eugene  PTA Acute  Rehabilitation Services Pager      [phone] Office      [phone]

## 2019-07-13 NOTE — Progress Notes (Signed)
Ambulated patient to the bathroom at 0420.  After voiding, while sitting on the toilet, the patient appeared to have a Vagal response, fainting, becoming minimally responsive.  Called for assistance, and, with the help of [place],RN, we assisted the patient to the recliner.  She was more easily aroused at this time.  VS obtained, see flowsheet. Pt. alert and oriented,  Complaining of increased pain to LEFT hip.  Informed that she would receive pain meds shortly after she was more alert.

## 2019-07-13 NOTE — Discharge Summary (Signed)
Patient ID: SOCORRO KANITZ MRN: [id number] DOB/AGE: [birth year] 49 y.o.  Admit date: 07/11/2019 Discharge date: 07/13/2019  Admission Diagnoses:  Principal Problem:   Primary osteoarthritis of left hip   Discharge Diagnoses:  Same  Past Medical History:  Diagnosis Date  . Arthritis   . Closed head injury 1995  . Endometriosis 2008  . Headache(784.0)   . HSV (herpes simplex virus) anogenital infection   . Hypothyroid   . 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 Low blood pressure   . 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 Seizure ([place])    controlled with med  . Short-term memory loss     Surgeries: Procedure(s): Left Anterior Hip Arthroplasty on 07/11/2019   Consultants:   Discharged Condition: Improved  Hospital Course: RIGBY SWAMY is an 48 y.o. female who was admitted 07/11/2019 for operative treatment ofPrimary osteoarthritis of left hip. Patient has severe unremitting pain that affects sleep, daily activities, and work/hobbies. After pre-op clearance the patient was taken to the operating room on 07/11/2019 and underwent  Procedure(s): Left Anterior Hip Arthroplasty.    Patient was given perioperative antibiotics:  Anti-infectives (From admission, onward)   Start     Dose/Rate Route Frequency Ordered Stop   07/12/19 0600  ceFAZolin (ANCEF) IVPB 2g/100 mL premix     2 g 200 mL/hr over 30 Minutes Intravenous On call to O.R. 07/11/19 1256 07/11/19 1456   07/11/19 2030  ceFAZolin (ANCEF) IVPB 2g/100 mL premix     2 g 200 mL/hr over 30 Minutes Intravenous Every 6 hours 07/11/19 2022 07/12/19 0246   07/11/19 1304  ceFAZolin (ANCEF) 2-4 GM/100ML-% IVPB    Note to Pharmacy: Kyra Leyland   : cabinet override      07/11/19 1304 07/11/19 1426       Patient was given sequential compression devices, early ambulation, and chemoprophylaxis to prevent DVT.  Patient  benefited maximally from hospital stay and there were no complications.    Recent vital signs:  Patient Vitals for the past 24 hrs:  BP Temp Temp src Pulse Resp SpO2  07/13/19 0431 112/69 98.4 F (36.9 C) Oral (!) 103 20 100 %  07/12/19 2123 (!) 101/44 98.5 F (36.9 C) Oral (!) 120 18 100 %  07/12/19 2005 - - - 100 16 -  07/12/19 1413 110/71 98.1 F (36.7 C) Oral 93 16 99 %     Recent laboratory studies:  Recent Labs    07/13/19 0752  WBC 10.0  HGB 10.7*  HCT 33.3*  PLT 179  NA 140  K 3.3*  CL 104  CO2 28  BUN 9  CREATININE 0.63  GLUCOSE 109*  CALCIUM 8.1*     Discharge Medications:   Allergies as of 07/13/2019      Reactions   Other Anaphylaxis   ALLERGIC TO ORAL CONTRACEPTIVES---SEIZURE.   Oxycodone    SEIZURE, but pt says she can take vicoden      Medication List    STOP taking these medications   ibuprofen 800 MG tablet Commonly known as: ADVIL     TAKE these medications   acetaZOLAMIDE 250 MG tablet Commonly known as: DIAMOX Take 125 mg by mouth as needed.   acyclovir ointment 5 % Commonly known as: ZOVIRAX Apply 1 application topically 5 (five) times daily as needed.   ALPRAZolam 0.5 MG tablet Commonly known as: XANAX Take 0.5  mg by mouth daily as needed for anxiety.   aspirin 81 MG chewable tablet Chew 1 tablet (81 mg total) by mouth 2 (two) times daily.   cholecalciferol 25 MCG (1000 UT) tablet Commonly known as: VITAMIN D3 Take 1,000 Units by mouth daily.   HYDROcodone-acetaminophen 5-325 MG tablet Commonly known as: NORCO/VICODIN Take 1-2 tablets by mouth every 6 (six) hours as needed for moderate pain (pain score 4-6).   LaMICtal XR 300 MG Tb24 24 hour tablet Generic drug: LamoTRIgine Take 300 mg by mouth 2 (two) times daily.   levETIRAcetam 500 MG 24 hr tablet Commonly known as: KEPPRA XR Take 1,000 mg by mouth 2 (two) times daily.   levothyroxine 125 MCG tablet Commonly known as: SYNTHROID Take 125 mcg by mouth daily.    multivitamin with minerals Tabs tablet Take 1 tablet by mouth daily.   Oxcarbazepine 300 MG tablet Commonly known as: TRILEPTAL Take 300 mg by mouth 2 (two) times daily.   tiZANidine 4 MG tablet Commonly known as: Zanaflex Take 1 tablet (4 mg total) by mouth every 6 (six) hours as needed.   vitamin B-12 100 MCG tablet Commonly known as: CYANOCOBALAMIN Take 100 mcg by mouth daily.            Durable Medical Equipment  (From admission, onward)         Start     Ordered   07/11/19 2018  DME Walker rolling  Once    Question:  Patient needs a walker to treat with the following condition  Answer:  Primary osteoarthritis of left hip   07/11/19 2022   07/11/19 2018  DME 3 n 1  Once     07/11/19 2022   07/11/19 2018  DME Bedside commode  Once    Question:  Patient needs a bedside commode to treat with the following condition  Answer:  Primary osteoarthritis of left hip   07/11/19 2022          Diagnostic Studies: Dg Chest 2 View  Result Date: 07/06/2019 CLINICAL DATA:  Preoperative EXAM: CHEST - 2 VIEW COMPARISON:  None. FINDINGS: The heart size and mediastinal contours are within normal limits. Both lungs are clear. The visualized skeletal structures are unremarkable. IMPRESSION: No acute abnormality of the lungs. Electronically Signed   By: Eddie Candle M.D.   On: 07/06/2019 15:39   Dg C-arm 1-60 Min-no Report  Result Date: 07/11/2019 Fluoroscopy was utilized by the requesting physician.  No radiographic interpretation.   Dg Hip Operative Unilat W Or W/o Pelvis Left  Result Date: 07/11/2019 CLINICAL DATA:  Intraoperative radiograph left-sided total hip replacement, history of OA EXAM: OPERATIVE LEFT HIP (WITH PELVIS IF PERFORMED) 2 VIEWS TECHNIQUE: Fluoroscopic spot image(s) were submitted for interpretation post-operatively. COMPARISON:  Left hip radiographs 10/19/2018 FINDINGS: Intraoperative radiographs depict a total left hip arthroplasty with expected alignment of the  femoral and acetabular articular components. No hardware complication or periprosthetic fracture is identified. Additional view of the low pelvis demonstrates mild degenerative changes in the right hip. The symphysis pubis is congruent. Expected surgical soft tissue changes are present in the left hip. IMPRESSION: Interval total left hip arthroplasty without evidence of acute postoperative complication. Electronically Signed   By: Lovena Le M.D.   On: 07/11/2019 17:47    Disposition: Discharge disposition: 01-Home or Self Care       Discharge Instructions    Call MD / Call 911   Complete by: As directed    If you experience  chest pain or shortness of breath, CALL 911 and be transported to the hospital emergency room.  If you develope a fever above 101 F, pus (white drainage) or increased drainage or redness at the wound, or calf pain, call your surgeon's office.   Constipation Prevention   Complete by: As directed    Drink plenty of fluids.  Prune juice may be helpful.  You may use a stool softener, such as Colace (over the counter) 100 mg twice a day.  Use MiraLax (over the counter) for constipation as needed.   Diet - low sodium heart healthy   Complete by: As directed    Discharge instructions   Complete by: As directed    INSTRUCTIONS AFTER JOINT REPLACEMENT   Remove items at home which could result in a fall. This includes throw rugs or furniture in walking pathways ICE to the affected joint every three hours while awake for 30 minutes at a time, for at least the first 3-5 days, and then as needed for pain and swelling.  Continue to use ice for pain and swelling. You may notice swelling that will progress down to the foot and ankle.  This is normal after surgery.  Elevate your leg when you are not up walking on it.   Continue to use the breathing machine you got in the hospital (incentive spirometer) which will help keep your temperature down.  It is common for your temperature to  cycle up and down following surgery, especially at night when you are not up moving around and exerting yourself.  The breathing machine keeps your lungs expanded and your temperature down.   DIET:  As you were doing prior to hospitalization, we recommend a well-balanced diet.  DRESSING / WOUND CARE / SHOWERING  You may shower 3 days after surgery, but keep the wounds dry during showering.  You may use an occlusive plastic wrap (Press'n Seal for example), NO SOAKING/SUBMERGING IN THE BATHTUB.  If the bandage gets wet, change with a clean dry gauze.  If the incision gets wet, pat the wound dry with a clean towel.  ACTIVITY  Increase activity slowly as tolerated, but follow the weight bearing instructions below.   No driving for 6 weeks or until further direction given by your physician.  You cannot drive while taking narcotics.  No lifting or carrying greater than 10 lbs. until further directed by your surgeon. Avoid periods of inactivity such as sitting longer than an hour when not asleep. This helps prevent blood clots.  You may return to work once you are authorized by your doctor.     WEIGHT BEARING   Weight bearing as tolerated with assist device (walker, cane, etc) as directed, use it as long as suggested by your surgeon or therapist, typically at least 4-6 weeks.   EXERCISES  Results after joint replacement surgery are often greatly improved when you follow the exercise, range of motion and muscle strengthening exercises prescribed by your doctor. Safety measures are also important to protect the joint from further injury. Any time any of these exercises cause you to have increased pain or swelling, decrease what you are doing until you are comfortable again and then slowly increase them. If you have problems or questions, call your caregiver or physical therapist for advice.   Rehabilitation is important following a joint replacement. After just a few days of immobilization, the  muscles of the leg can become weakened and shrink (atrophy).  These exercises are designed to build  up the tone and strength of the thigh and leg muscles and to improve motion. Often times heat used for twenty to thirty minutes before working out will loosen up your tissues and help with improving the range of motion but do not use heat for the first two weeks following surgery (sometimes heat can increase post-operative swelling).   These exercises can be done on a training (exercise) mat, on the floor, on a table or on a bed. Use whatever works the best and is most comfortable for you.    Use music or television while you are exercising so that the exercises are a pleasant break in your day. This will make your life better with the exercises acting as a break in your routine that you can look forward to.   Perform all exercises about fifteen times, three times per day or as directed.  You should exercise both the operative leg and the other leg as well.   Exercises include:   Quad Sets - Tighten up the muscle on the front of the thigh (Quad) and hold for 5-10 seconds.   Straight Leg Raises - With your knee straight (if you were given a brace, keep it on), lift the leg to 60 degrees, hold for 3 seconds, and slowly lower the leg.  Perform this exercise against resistance later as your leg gets stronger.  Leg Slides: Lying on your back, slowly slide your foot toward your buttocks, bending your knee up off the floor (only go as far as is comfortable). Then slowly slide your foot back down until your leg is flat on the floor again.  Angel Wings: Lying on your back spread your legs to the side as far apart as you can without causing discomfort.  Hamstring Strength:  Lying on your back, push your heel against the floor with your leg straight by tightening up the muscles of your buttocks.  Repeat, but this time bend your knee to a comfortable angle, and push your heel against the floor.  You may put a pillow  under the heel to make it more comfortable if necessary.   A rehabilitation program following joint replacement surgery can speed recovery and prevent re-injury in the future due to weakened muscles. Contact your doctor or a physical therapist for more information on knee rehabilitation.    CONSTIPATION  Constipation is defined medically as fewer than three stools per week and severe constipation as less than one stool per week.  Even if you have a regular bowel pattern at home, your normal regimen is likely to be disrupted due to multiple reasons following surgery.  Combination of anesthesia, postoperative narcotics, change in appetite and fluid intake all can affect your bowels.   YOU MUST use at least one of the following options; they are listed in order of increasing strength to get the job done.  They are all available over the counter, and you may need to use some, POSSIBLY even all of these options:    Drink plenty of fluids (prune juice may be helpful) and high fiber foods Colace 100 mg by mouth twice a day  Senokot for constipation as directed and as needed Dulcolax (bisacodyl), take with full glass of water  Miralax (polyethylene glycol) once or twice a day as needed.  If you have tried all these things and are unable to have a bowel movement in the first 3-4 days after surgery call either your surgeon or your primary doctor.    If you  experience loose stools or diarrhea, hold the medications until you stool forms back up.  If your symptoms do not get better within 1 week or if they get worse, check with your doctor.  If you experience "the worst abdominal pain ever" or develop nausea or vomiting, please contact the office immediately for further recommendations for treatment.   ITCHING:  If you experience itching with your medications, try taking only a single pain pill, or even half a pain pill at a time.  You can also use Benadryl over the counter for itching or also to help with  sleep.   TED HOSE STOCKINGS:  Use stockings on both legs until for at least 2 weeks or as directed by physician office. They may be removed at night for sleeping.  MEDICATIONS:  See your medication summary on the "After Visit Summary" that nursing will review with you.  You may have some home medications which will be placed on hold until you complete the course of blood thinner medication.  It is important for you to complete the blood thinner medication as prescribed.  PRECAUTIONS:  If you experience chest pain or shortness of breath - call 911 immediately for transfer to the hospital emergency department.   If you develop a fever greater that 101 F, purulent drainage from wound, increased redness or drainage from wound, foul odor from the wound/dressing, or calf pain - CONTACT YOUR SURGEON.                                                   FOLLOW-UP APPOINTMENTS:  If you do not already have a post-op appointment, please call the office for an appointment to be seen by your surgeon.  Guidelines for how soon to be seen are listed in your "After Visit Summary", but are typically between 1-4 weeks after surgery.  OTHER INSTRUCTIONS:   Knee Replacement:  Do not place pillow under knee, focus on keeping the knee straight while resting. CPM instructions: 0-90 degrees, 2 hours in the morning, 2 hours in the afternoon, and 2 hours in the evening. Place foam block, curve side up under heel at all times except when in CPM or when walking.  DO NOT modify, tear, cut, or change the foam block in any way.  MAKE SURE YOU:  Understand these instructions.  Get help right away if you are not doing well or get worse.    Thank you for letting us be a part of your medical care team.  It is a privilege we respect greatly.  We hope these instructions will help you stay on track for a fast and full recovery!   Increase activity slowly as tolerated   Complete by: As directed       Follow-up Information     Melrose Nakayama, MD. Schedule an appointment as soon as possible for a visit in 2 weeks.   Specialty: Orthopedic Surgery Contact information: [place] [address] [phone]        [facility] Follow up.   Specialty: [facility] Contact information: [place] [address] [phone]            Signed: Larwance Sachs Geronimo Diliberto 07/13/2019, 1:31 PM

## 2019-07-13 NOTE — Progress Notes (Signed)
Physical Therapy Treatment Patient Details Name: [name] MRN: [id number] DOB: 05-10-1971 Today's Date: 07/13/2019    History of Present Illness Pt is a 48 year old female s/p Left DA THA    PT Comments    POD # 2 am session.   General bed mobility comments: demonstared and instructed how to use belt loop to self assist L LE off bed.  required increased time.  General transfer comment: 25% VC's on safety and increased time.  General Gait Details: increased time and 25% VC's on safety as pt tends to get distrated due to pain and emotional state. Pt required repeat instruction that was given to her yesterday. Pt plans to D/C to home later today.  Will need another PT session to sddress stairs.   Follow Up Recommendations  Follow surgeon's recommendation for DC plan and follow-up therapies     Equipment Recommendations  Rolling walker with 5" wheels;3in1 (PT)    Recommendations for Other Services       Precautions / Restrictions Precautions Precautions: Fall Restrictions Weight Bearing Restrictions: No Other Position/Activity Restrictions: WBAT    Mobility  Bed Mobility Overal bed mobility: Needs Assistance Bed Mobility: Supine to Sit     Supine to sit: Min guard;Supervision     General bed mobility comments: demonstared and instructed how to use belt loop to self assist L LE off bed.  required increased time  Transfers Overall transfer level: Needs assistance Equipment used: Rolling walker (2 wheeled) Transfers: Sit to/from [facility] Sit to Stand: Supervision Stand pivot transfers: Supervision;Min guard       General transfer comment: 25% VC's on safety and increased time  Ambulation/Gait Ambulation/Gait assistance: Min guard;Supervision Gait Distance (Feet): 85 Feet Assistive device: Rolling walker (2 wheeled) Gait Pattern/deviations: Step-to pattern;Decreased stance time - left;Antalgic Gait velocity: decreased   General  Gait Details: increased time and 25% VC's on safety as pt tends to get distrated due to pain and emotional state.   Stairs             Wheelchair Mobility    Modified Rankin (Stroke Patients Only)       Balance                                            Cognition Arousal/Alertness: Awake/alert Behavior During Therapy: WFL for tasks assessed/performed Overall Cognitive Status: Within Functional Limits for tasks assessed                                 General Comments: emotional      Exercises      General Comments        Pertinent Vitals/Pain Pain Assessment: 0-10 Pain Score: 9  Pain Location: L hip Pain Descriptors / Indicators: Grimacing;Tender;Crying Pain Intervention(s): Monitored during session;Premedicated before session;Repositioned;Ice applied    Home Living                      Prior Function            PT Goals (current goals can now be found in the care plan section) Progress towards PT goals: Progressing toward goals    Frequency    7X/week      PT Plan Current plan remains appropriate    Co-evaluation  AM-PAC PT "6 Clicks" Mobility   Outcome Measure  Help needed turning from your back to your side while in a flat bed without using bedrails?: A Little Help needed moving from lying on your back to sitting on the side of a flat bed without using bedrails?: A Little Help needed moving to and from a bed to a chair (including a wheelchair)?: A Little Help needed standing up from a chair using your arms (e.g., wheelchair or bedside chair)?: A Little Help needed to walk in hospital room?: A Little Help needed climbing 3-5 steps with a railing? : A Little 6 Click Score: 18    End of Session Equipment Utilized During Treatment: Gait belt Activity Tolerance: Patient tolerated treatment well Patient left: in chair;with chair alarm set;with call bell/phone within reach   PT  Visit Diagnosis: Other abnormalities of gait and mobility (R26.89);Pain Pain - Right/Left: Left Pain - part of body: Hip     Time: 1212-1240 PT Time Calculation (min) (ACUTE ONLY): 28 min  Charges:  $Gait Training: 8-22 mins $Therapeutic Activity: 8-22 mins                     Rica Koyanagi  PTA Acute  Rehabilitation Services Pager      [phone] Office      [phone]

## 2019-07-13 NOTE — Discharge Summary (Signed)
Patient ID: [name] MRN: [id number] DOB/AGE: [birth year] 48 y.o.  Admit date: 07/11/2019 Discharge date: 07/13/2019  Admission Diagnoses:  Principal Problem:   Primary osteoarthritis of left hip   Discharge Diagnoses:  Same  Past Medical History:  Diagnosis Date  . Arthritis   . Closed head injury 1995  . Endometriosis 2008  . Headache(784.0)   . HSV (herpes simplex virus) anogenital infection   . Hypothyroid   . 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 Low blood pressure   . 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 Seizure ([place])    controlled with med  . Short-term memory loss     Surgeries: Procedure(s): Left Anterior Hip Arthroplasty on 07/11/2019   Consultants:   Discharged Condition: Improved  Hospital Course: ARIS MOMAN is an 48 y.o. female who was admitted 07/11/2019 for operative treatment ofPrimary osteoarthritis of left hip. Patient has severe unremitting pain that affects sleep, daily activities, and work/hobbies. After pre-op clearance the patient was taken to the operating room on 07/11/2019 and underwent  Procedure(s): Left Anterior Hip Arthroplasty.    Patient was given perioperative antibiotics:  Anti-infectives (From admission, onward)   Start     Dose/Rate Route Frequency Ordered Stop   07/12/19 0600  ceFAZolin (ANCEF) IVPB 2g/100 mL premix     2 g 200 mL/hr over 30 Minutes Intravenous On call to O.R. 07/11/19 1256 07/11/19 1456   07/11/19 2030  ceFAZolin (ANCEF) IVPB 2g/100 mL premix     2 g 200 mL/hr over 30 Minutes Intravenous Every 6 hours 07/11/19 2022 07/12/19 0246   07/11/19 1304  ceFAZolin (ANCEF) 2-4 GM/100ML-% IVPB    Note to Pharmacy: Kyra Leyland   : cabinet override      07/11/19 1304 07/11/19 1426       Patient was given sequential compression devices, early ambulation, and chemoprophylaxis to prevent DVT.  Patient  benefited maximally from hospital stay and there were no complications.    Recent vital signs:  Patient Vitals for the past 24 hrs:  BP Temp Temp src Pulse Resp SpO2  07/13/19 0431 112/69 98.4 F (36.9 C) Oral (!) 103 20 100 %  07/12/19 2123 (!) 101/44 98.5 F (36.9 C) Oral (!) 120 18 100 %  07/12/19 2005 - - - 100 16 -  07/12/19 1413 110/71 98.1 F (36.7 C) Oral 93 16 99 %     Recent laboratory studies:  Recent Labs    07/13/19 0752  WBC 10.0  HGB 10.7*  HCT 33.3*  PLT 179  NA 140  K 3.3*  CL 104  CO2 28  BUN 9  CREATININE 0.63  GLUCOSE 109*  CALCIUM 8.1*     Discharge Medications:   Allergies as of 07/13/2019      Reactions   Other Anaphylaxis   ALLERGIC TO ORAL CONTRACEPTIVES---SEIZURE.   Oxycodone    SEIZURE, but pt says she can take vicoden      Medication List    STOP taking these medications   ibuprofen 800 MG tablet Commonly known as: ADVIL     TAKE these medications   acetaZOLAMIDE 250 MG tablet Commonly known as: DIAMOX Take 125 mg by mouth as needed.   acyclovir ointment 5 % Commonly known as: ZOVIRAX Apply 1 application topically 5 (five) times daily as needed.   ALPRAZolam 0.5 MG tablet Commonly known as: XANAX Take 0.5  mg by mouth daily as needed for anxiety.   aspirin 81 MG chewable tablet Chew 1 tablet (81 mg total) by mouth 2 (two) times daily.   cholecalciferol 25 MCG (1000 UT) tablet Commonly known as: VITAMIN D3 Take 1,000 Units by mouth daily.   HYDROcodone-acetaminophen 5-325 MG tablet Commonly known as: NORCO/VICODIN Take 1-2 tablets by mouth every 6 (six) hours as needed for moderate pain (pain score 4-6).   LaMICtal XR 300 MG Tb24 24 hour tablet Generic drug: LamoTRIgine Take 300 mg by mouth 2 (two) times daily.   levETIRAcetam 500 MG 24 hr tablet Commonly known as: KEPPRA XR Take 1,000 mg by mouth 2 (two) times daily.   levothyroxine 125 MCG tablet Commonly known as: SYNTHROID Take 125 mcg by mouth daily.    multivitamin with minerals Tabs tablet Take 1 tablet by mouth daily.   Oxcarbazepine 300 MG tablet Commonly known as: TRILEPTAL Take 300 mg by mouth 2 (two) times daily.   tiZANidine 4 MG tablet Commonly known as: Zanaflex Take 1 tablet (4 mg total) by mouth every 6 (six) hours as needed.   vitamin B-12 100 MCG tablet Commonly known as: CYANOCOBALAMIN Take 100 mcg by mouth daily.            Durable Medical Equipment  (From admission, onward)         Start     Ordered   07/11/19 2018  DME Walker rolling  Once    Question:  Patient needs a walker to treat with the following condition  Answer:  Primary osteoarthritis of left hip   07/11/19 2022   07/11/19 2018  DME 3 n 1  Once     07/11/19 2022   07/11/19 2018  DME Bedside commode  Once    Question:  Patient needs a bedside commode to treat with the following condition  Answer:  Primary osteoarthritis of left hip   07/11/19 2022          Diagnostic Studies: Dg Chest 2 View  Result Date: 07/06/2019 CLINICAL DATA:  Preoperative EXAM: CHEST - 2 VIEW COMPARISON:  None. FINDINGS: The heart size and mediastinal contours are within normal limits. Both lungs are clear. The visualized skeletal structures are unremarkable. IMPRESSION: No acute abnormality of the lungs. Electronically Signed   By: Eddie Candle M.D.   On: 07/06/2019 15:39   Dg C-arm 1-60 Min-no Report  Result Date: 07/11/2019 Fluoroscopy was utilized by the requesting physician.  No radiographic interpretation.   Dg Hip Operative Unilat W Or W/o Pelvis Left  Result Date: 07/11/2019 CLINICAL DATA:  Intraoperative radiograph left-sided total hip replacement, history of OA EXAM: OPERATIVE LEFT HIP (WITH PELVIS IF PERFORMED) 2 VIEWS TECHNIQUE: Fluoroscopic spot image(s) were submitted for interpretation post-operatively. COMPARISON:  Left hip radiographs 10/19/2018 FINDINGS: Intraoperative radiographs depict a total left hip arthroplasty with expected alignment of the  femoral and acetabular articular components. No hardware complication or periprosthetic fracture is identified. Additional view of the low pelvis demonstrates mild degenerative changes in the right hip. The symphysis pubis is congruent. Expected surgical soft tissue changes are present in the left hip. IMPRESSION: Interval total left hip arthroplasty without evidence of acute postoperative complication. Electronically Signed   By: Lovena Le M.D.   On: 07/11/2019 17:47    Disposition: Discharge disposition: 01-Home or Self Care       Discharge Instructions    Call MD / Call 911   Complete by: As directed    If you experience  chest pain or shortness of breath, CALL 911 and be transported to the hospital emergency room.  If you develope a fever above 101 F, pus (white drainage) or increased drainage or redness at the wound, or calf pain, call your surgeon's office.   Constipation Prevention   Complete by: As directed    Drink plenty of fluids.  Prune juice may be helpful.  You may use a stool softener, such as Colace (over the counter) 100 mg twice a day.  Use MiraLax (over the counter) for constipation as needed.   Diet - low sodium heart healthy   Complete by: As directed    Discharge instructions   Complete by: As directed    INSTRUCTIONS AFTER JOINT REPLACEMENT   Remove items at home which could result in a fall. This includes throw rugs or furniture in walking pathways ICE to the affected joint every three hours while awake for 30 minutes at a time, for at least the first 3-5 days, and then as needed for pain and swelling.  Continue to use ice for pain and swelling. You may notice swelling that will progress down to the foot and ankle.  This is normal after surgery.  Elevate your leg when you are not up walking on it.   Continue to use the breathing machine you got in the hospital (incentive spirometer) which will help keep your temperature down.  It is common for your temperature to  cycle up and down following surgery, especially at night when you are not up moving around and exerting yourself.  The breathing machine keeps your lungs expanded and your temperature down.   DIET:  As you were doing prior to hospitalization, we recommend a well-balanced diet.  DRESSING / WOUND CARE / SHOWERING  You may shower 3 days after surgery, but keep the wounds dry during showering.  You may use an occlusive plastic wrap (Press'n Seal for example), NO SOAKING/SUBMERGING IN THE BATHTUB.  If the bandage gets wet, change with a clean dry gauze.  If the incision gets wet, pat the wound dry with a clean towel.  ACTIVITY  Increase activity slowly as tolerated, but follow the weight bearing instructions below.   No driving for 6 weeks or until further direction given by your physician.  You cannot drive while taking narcotics.  No lifting or carrying greater than 10 lbs. until further directed by your surgeon. Avoid periods of inactivity such as sitting longer than an hour when not asleep. This helps prevent blood clots.  You may return to work once you are authorized by your doctor.     WEIGHT BEARING   Weight bearing as tolerated with assist device (walker, cane, etc) as directed, use it as long as suggested by your surgeon or therapist, typically at least 4-6 weeks.   EXERCISES  Results after joint replacement surgery are often greatly improved when you follow the exercise, range of motion and muscle strengthening exercises prescribed by your doctor. Safety measures are also important to protect the joint from further injury. Any time any of these exercises cause you to have increased pain or swelling, decrease what you are doing until you are comfortable again and then slowly increase them. If you have problems or questions, call your caregiver or physical therapist for advice.   Rehabilitation is important following a joint replacement. After just a few days of immobilization, the  muscles of the leg can become weakened and shrink (atrophy).  These exercises are designed to build  up the tone and strength of the thigh and leg muscles and to improve motion. Often times heat used for twenty to thirty minutes before working out will loosen up your tissues and help with improving the range of motion but do not use heat for the first two weeks following surgery (sometimes heat can increase post-operative swelling).   These exercises can be done on a training (exercise) mat, on the floor, on a table or on a bed. Use whatever works the best and is most comfortable for you.    Use music or television while you are exercising so that the exercises are a pleasant break in your day. This will make your life better with the exercises acting as a break in your routine that you can look forward to.   Perform all exercises about fifteen times, three times per day or as directed.  You should exercise both the operative leg and the other leg as well.   Exercises include:   Quad Sets - Tighten up the muscle on the front of the thigh (Quad) and hold for 5-10 seconds.   Straight Leg Raises - With your knee straight (if you were given a brace, keep it on), lift the leg to 60 degrees, hold for 3 seconds, and slowly lower the leg.  Perform this exercise against resistance later as your leg gets stronger.  Leg Slides: Lying on your back, slowly slide your foot toward your buttocks, bending your knee up off the floor (only go as far as is comfortable). Then slowly slide your foot back down until your leg is flat on the floor again.  Angel Wings: Lying on your back spread your legs to the side as far apart as you can without causing discomfort.  Hamstring Strength:  Lying on your back, push your heel against the floor with your leg straight by tightening up the muscles of your buttocks.  Repeat, but this time bend your knee to a comfortable angle, and push your heel against the floor.  You may put a pillow  under the heel to make it more comfortable if necessary.   A rehabilitation program following joint replacement surgery can speed recovery and prevent re-injury in the future due to weakened muscles. Contact your doctor or a physical therapist for more information on knee rehabilitation.    CONSTIPATION  Constipation is defined medically as fewer than three stools per week and severe constipation as less than one stool per week.  Even if you have a regular bowel pattern at home, your normal regimen is likely to be disrupted due to multiple reasons following surgery.  Combination of anesthesia, postoperative narcotics, change in appetite and fluid intake all can affect your bowels.   YOU MUST use at least one of the following options; they are listed in order of increasing strength to get the job done.  They are all available over the counter, and you may need to use some, POSSIBLY even all of these options:    Drink plenty of fluids (prune juice may be helpful) and high fiber foods Colace 100 mg by mouth twice a day  Senokot for constipation as directed and as needed Dulcolax (bisacodyl), take with full glass of water  Miralax (polyethylene glycol) once or twice a day as needed.  If you have tried all these things and are unable to have a bowel movement in the first 3-4 days after surgery call either your surgeon or your primary doctor.    If you  experience loose stools or diarrhea, hold the medications until you stool forms back up.  If your symptoms do not get better within 1 week or if they get worse, check with your doctor.  If you experience "the worst abdominal pain ever" or develop nausea or vomiting, please contact the office immediately for further recommendations for treatment.   ITCHING:  If you experience itching with your medications, try taking only a single pain pill, or even half a pain pill at a time.  You can also use Benadryl over the counter for itching or also to help with  sleep.   TED HOSE STOCKINGS:  Use stockings on both legs until for at least 2 weeks or as directed by physician office. They may be removed at night for sleeping.  MEDICATIONS:  See your medication summary on the "After Visit Summary" that nursing will review with you.  You may have some home medications which will be placed on hold until you complete the course of blood thinner medication.  It is important for you to complete the blood thinner medication as prescribed.  PRECAUTIONS:  If you experience chest pain or shortness of breath - call 911 immediately for transfer to the hospital emergency department.   If you develop a fever greater that 101 F, purulent drainage from wound, increased redness or drainage from wound, foul odor from the wound/dressing, or calf pain - CONTACT YOUR SURGEON.                                                   FOLLOW-UP APPOINTMENTS:  If you do not already have a post-op appointment, please call the office for an appointment to be seen by your surgeon.  Guidelines for how soon to be seen are listed in your "After Visit Summary", but are typically between 1-4 weeks after surgery.  OTHER INSTRUCTIONS:   Knee Replacement:  Do not place pillow under knee, focus on keeping the knee straight while resting. CPM instructions: 0-90 degrees, 2 hours in the morning, 2 hours in the afternoon, and 2 hours in the evening. Place foam block, curve side up under heel at all times except when in CPM or when walking.  DO NOT modify, tear, cut, or change the foam block in any way.  MAKE SURE YOU:  Understand these instructions.  Get help right away if you are not doing well or get worse.    Thank you for letting us be a part of your medical care team.  It is a privilege we respect greatly.  We hope these instructions will help you stay on track for a fast and full recovery!   Increase activity slowly as tolerated   Complete by: As directed       Follow-up Information     Melrose Nakayama, MD. Schedule an appointment as soon as possible for a visit in 2 weeks.   Specialty: Orthopedic Surgery Contact information: [place] [address] [phone]        [facility] Follow up.   Specialty: [facility] Contact information: [address] [place] [address] [phone]            Signed: Larwance Sachs Panfilo Ketchum 07/13/2019, 1:31 PM

## 2019-07-13 NOTE — Plan of Care (Signed)
Plan of care reviewed and discussed with the patient. 

## 2019-07-13 NOTE — Progress Notes (Signed)
Subjective: 2 Days Post-Op Procedure(s) (LRB): Left Anterior Hip Arthroplasty (Left)   Patient is resting in bedside chair this morning. She states she is in pain. The nurse took her to the restroom and she got a little lightheaded. Her BP was ok but this made her nervous.   Activity level:  wbat Diet tolerance:  ok Voiding:  ok Patient reports pain as moderate.    Objective: Vital signs in last 24 hours: Temp:  [98.1 F (36.7 C)-98.5 F (36.9 C)] 98.4 F (36.9 C) (08/13 0431) Pulse Rate:  [88-120] 103 (08/13 0431) Resp:  [16-20] 20 (08/13 0431) BP: (101-122)/(44-72) 112/69 (08/13 0431) SpO2:  [99 %-100 %] 100 % (08/13 0431)  Labs: No results for input(s): HGB in the last 72 hours. No results for input(s): WBC, RBC, HCT, PLT in the last 72 hours. No results for input(s): NA, K, CL, CO2, BUN, CREATININE, GLUCOSE, CALCIUM in the last 72 hours. No results for input(s): LABPT, INR in the last 72 hours.  Physical Exam:  Neurologically intact ABD soft Neurovascular intact Sensation intact distally Intact pulses distally Dorsiflexion/Plantar flexion intact Incision: dressing C/D/I and no drainage No cellulitis present Compartment soft  Assessment/Plan:  2 Days Post-Op Procedure(s) (LRB): Left Anterior Hip Arthroplasty (Left) Advance diet Up with therapy Discharge home with home health if doing better this afternoon and cleared by PT. I will order CBC, BMP, and a 500cc bolus of saline. We are going to hold off on IV pain meds as this seems to be making her a little dizzy and confused with her history of brain injury. I will check back on her mid day. We will wait to give the bolus until her labs have been drawn.  [name] 07/13/2019, 7:02 AM

## 2019-08-09 ENCOUNTER — Encounter: Payer: Medicare Other | Admitting: Gynecology

## 2019-08-22 ENCOUNTER — Encounter: Payer: Self-pay | Admitting: Gynecology

## 2019-10-05 ENCOUNTER — Other Ambulatory Visit: Payer: Self-pay

## 2019-10-09 ENCOUNTER — Encounter: Payer: Medicare Other | Admitting: Gynecology

## 2019-10-16 ENCOUNTER — Encounter: Payer: Self-pay | Admitting: Gynecology

## 2019-10-16 ENCOUNTER — Other Ambulatory Visit: Payer: Self-pay

## 2019-10-16 ENCOUNTER — Ambulatory Visit (INDEPENDENT_AMBULATORY_CARE_PROVIDER_SITE_OTHER): Payer: Medicare Other | Admitting: Gynecology

## 2019-10-16 VITALS — BP 120/76 | Ht 66.0 in | Wt 178.0 lb

## 2019-10-16 DIAGNOSIS — Z01419 Encounter for gynecological examination (general) (routine) without abnormal findings: Secondary | ICD-10-CM | POA: Diagnosis not present

## 2019-10-16 NOTE — Patient Instructions (Signed)
Schedule your mammogram.  Follow-up in 1 year for annual exam. 

## 2019-10-16 NOTE — Addendum Note (Signed)
Addended by: Nelva Nay on: 10/16/2019 03:31 PM   Modules accepted: Orders

## 2019-10-16 NOTE — Progress Notes (Signed)
    [name] 04-16-1971 [id number]        48 y.o.  G0P0 for annual gynecologic exam.  Without gynecologic complaints  Past medical history,surgical history, problem list, medications, allergies, family history and social history were all reviewed and documented as reviewed in the EPIC chart.  ROS:  Performed with pertinent positives and negatives included in the history, assessment and plan.   Additional significant findings : None   Exam: Caryn Bee assistant Vitals:   10/16/19 1403  BP: 120/76  Weight: 178 lb (80.7 kg)  Height: 5\' 6"  (1.676 m)   Body mass index is 28.73 kg/m.  General appearance:  Normal affect, orientation and appearance. Skin: Grossly normal HEENT: Without gross lesions.  No cervical or supraclavicular adenopathy. Thyroid normal.  Lungs:  Clear without wheezing, rales or rhonchi Cardiac: RR, without RMG Abdominal:  Soft, nontender, without masses, guarding, rebound, organomegaly or hernia Breasts:  Examined lying and sitting without masses, retractions, discharge or axillary adenopathy. Pelvic:  Ext, BUS, Vagina: Normal.  Pap smear of vaginal cuff done  Adnexa: Without masses or tenderness    Anus and perineum: Normal   Rectovaginal: Normal sphincter tone without palpated masses or tenderness.    Assessment/Plan:  48 y.o. G0P0 female for annual gynecologic exam.  Status post LAVH LSO 2014 for endometriosis.  1. Doing well without menopausal symptoms. 2. Pap smear 2017.  Pap smear done today.  No history of abnormal Pap smears.  Options to stop screening per current screening guidelines based on hysterectomy reviewed.  Will readdress on an annual basis. 3. Mammography 07/2018.  Due for mammography now and I reminded patient to schedule.  Breast exam normal today. 4. Health maintenance.  No routine lab work done as patient does this elsewhere.  Follow-up 1 year, sooner as needed.   Anastasio Auerbach MD, 2:22 PM 10/16/2019

## 2019-10-17 LAB — PAP IG W/ RFLX HPV ASCU

## 2020-06-17 ENCOUNTER — Other Ambulatory Visit: Payer: Self-pay

## 2020-06-17 ENCOUNTER — Ambulatory Visit: Payer: Medicare Other | Admitting: Nurse Practitioner

## 2020-06-17 ENCOUNTER — Encounter: Payer: Self-pay | Admitting: Nurse Practitioner

## 2020-06-17 VITALS — BP 118/78

## 2020-06-17 DIAGNOSIS — M25552 Pain in left hip: Secondary | ICD-10-CM
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
## 2020-06-17 NOTE — Patient Instructions (Signed)
Tylenol 1000 mg every 6 hours as needed  Aleve in between if needed

## 2021-01-10 DIAGNOSIS — G4700 Insomnia, unspecified: Secondary | ICD-10-CM | POA: Diagnosis not present

## 2021-01-10 DIAGNOSIS — M1711 Unilateral primary osteoarthritis, right knee: Secondary | ICD-10-CM | POA: Diagnosis not present

## 2021-01-10 DIAGNOSIS — M1612 Unilateral primary osteoarthritis, left hip: Secondary | ICD-10-CM | POA: Diagnosis not present

## 2021-01-10 DIAGNOSIS — E038 Other specified hypothyroidism: Secondary | ICD-10-CM | POA: Diagnosis not present

## 2021-01-10 DIAGNOSIS — M1712 Unilateral primary osteoarthritis, left knee: Secondary | ICD-10-CM | POA: Diagnosis not present

## 2021-01-10 DIAGNOSIS — E039 Hypothyroidism, unspecified: Secondary | ICD-10-CM | POA: Diagnosis not present

## 2021-01-13 ENCOUNTER — Other Ambulatory Visit: Payer: Self-pay | Admitting: Orthopaedic Surgery

## 2021-01-13 DIAGNOSIS — Z01818 Encounter for other preprocedural examination: Secondary | ICD-10-CM

## 2021-01-20 DIAGNOSIS — E038 Other specified hypothyroidism: Secondary | ICD-10-CM | POA: Diagnosis not present

## 2021-01-20 DIAGNOSIS — M1711 Unilateral primary osteoarthritis, right knee: Secondary | ICD-10-CM | POA: Diagnosis not present

## 2021-01-29 NOTE — Progress Notes (Signed)
DUE TO COVID-19 ONLY ONE VISITOR IS ALLOWED TO COME WITH YOU AND STAY IN THE WAITING ROOM ONLY DURING PRE OP AND PROCEDURE DAY OF SURGERY. THE 1 VISITOR  MAY VISIT WITH YOU AFTER SURGERY IN YOUR PRIVATE ROOM DURING VISITING HOURS ONLY!  YOU NEED TO HAVE A COVID 19 TEST ON__3/09/2021 _____ @_______ , THIS TEST MUST BE DONE BEFORE SURGERY,  COVID TESTING SITE [address] [place] [address], IT IS ON THE RIGHT GOING OUT [address] APPROXIMATELY  2 MINUTES PAST [employer] ON THE RIGHT. ONCE YOUR COVID TEST IS COMPLETED,  PLEASE BEGIN THE QUARANTINE INSTRUCTIONS AS OUTLINED IN YOUR HANDOUT.                [name]  01/29/2021   Your procedure is scheduled on: 02/11/2021    Report to [facility] Main  Entrance   Report to admitting at    0730 AM     Call this number if you have problems the morning of surgery [phone]    REMEMBER: NO  SOLID FOOD CANDY OR GUM AFTER MIDNIGHT. CLEAR LIQUIDS UNTIL 0700am         . NOTHING BY MOUTH EXCEPT CLEAR LIQUIDS UNTIL    . PLEASE FINISH ENSURE DRINK PER SURGEON ORDER  WHICH NEEDS TO BE COMPLETED AT  0700am   .      CLEAR LIQUID DIET   Foods Allowed                                                                    Coffee and tea, regular and decaf                            Fruit ices (not with fruit pulp)                                      Iced Popsicles                                    Carbonated beverages, regular and diet                                    Cranberry, grape and apple juices Sports drinks like Gatorade Lightly seasoned clear broth or consume(fat free) Sugar, honey syrup ___________________________________________________________________      BRUSH YOUR TEETH MORNING OF SURGERY AND RINSE YOUR MOUTH OUT, NO CHEWING GUM CANDY OR MINTS.     Take these medicines the morning of surgery with A SIP OF WATER: Lamtrogine, Keppra, synthroid, Trileptal   DO NOT TAKE ANY DIABETIC  MEDICATIONS DAY OF YOUR SURGERY                               You may not have any metal on your body including hair pins and              piercings  Do  not wear jewelry, make-up, lotions, powders or perfumes, deodorant             Do not wear nail polish on your fingernails.  Do not shave  48 hours prior to surgery.              Men may shave face and neck.   Do not bring valuables to the hospital. [place].  Contacts, dentures or bridgework may not be worn into surgery.  Leave suitcase in the car. After surgery it may be brought to your room.     Patients discharged the day of surgery will not be allowed to drive home. IF YOU ARE HAVING SURGERY AND GOING HOME THE SAME DAY, YOU MUST HAVE AN ADULT TO DRIVE YOU HOME AND BE WITH YOU FOR 24 HOURS. YOU MAY GO HOME BY TAXI OR UBER OR ORTHERWISE, BUT AN ADULT MUST ACCOMPANY YOU HOME AND STAY WITH YOU FOR 24 HOURS.  Name and phone number of your driver:  Special Instructions: N/A              Please read over the following fact sheets you were given: _____________________________________________________________________  [facility] - Preparing for Surgery Before surgery, you can play an important role.  Because skin is not sterile, your skin needs to be as free of germs as possible.  You can reduce the number of germs on your skin by washing with CHG (chlorahexidine gluconate) soap before surgery.  CHG is an antiseptic cleaner which kills germs and bonds with the skin to continue killing germs even after washing. Please DO NOT use if you have an allergy to CHG or antibacterial soaps.  If your skin becomes reddened/irritated stop using the CHG and inform your nurse when you arrive at Short Stay. Do not shave (including legs and underarms) for at least 48 hours prior to the first CHG shower.  You may shave your face/neck. Please follow these instructions carefully:  1.  Shower with CHG Soap the night  before surgery and the  morning of Surgery.  2.  If you choose to wash your hair, wash your hair first as usual with your  normal  shampoo.  3.  After you shampoo, rinse your hair and body thoroughly to remove the  shampoo.                           4.  Use CHG as you would any other liquid soap.  You can apply chg directly  to the skin and wash                       Gently with a scrungie or clean washcloth.  5.  Apply the CHG Soap to your body ONLY FROM THE NECK DOWN.   Do not use on face/ open                           Wound or open sores. Avoid contact with eyes, ears mouth and genitals (private parts).                       Wash face,  Genitals (private parts) with your normal soap.             6.  Wash thoroughly,  paying special attention to the area where your surgery  will be performed.  7.  Thoroughly rinse your body with warm water from the neck down.  8.  DO NOT shower/wash with your normal soap after using and rinsing off  the CHG Soap.                9.  Pat yourself dry with a clean towel.            10.  Wear clean pajamas.            11.  Place clean sheets on your bed the night of your first shower and do not  sleep with pets. Day of Surgery : Do not apply any lotions/deodorants the morning of surgery.  Please wear clean clothes to the hospital/surgery center.  FAILURE TO FOLLOW THESE INSTRUCTIONS MAY RESULT IN THE CANCELLATION OF YOUR SURGERY PATIENT SIGNATURE_________________________________  NURSE SIGNATURE__________________________________  ________________________________________________________________________

## 2021-02-04 ENCOUNTER — Other Ambulatory Visit: Payer: Self-pay

## 2021-02-04 ENCOUNTER — Encounter (HOSPITAL_COMMUNITY)
Admission: RE | Admit: 2021-02-04 | Discharge: 2021-02-04 | Disposition: A | Discharge status: Home / Self Care | Payer: Medicare Other | Source: Ambulatory Visit | Attending: Orthopaedic Surgery | Admitting: Orthopaedic Surgery

## 2021-02-04 ENCOUNTER — Encounter (INDEPENDENT_AMBULATORY_CARE_PROVIDER_SITE_OTHER): Payer: Self-pay

## 2021-02-04 ENCOUNTER — Encounter (HOSPITAL_COMMUNITY): Payer: Self-pay

## 2021-02-04 ENCOUNTER — Ambulatory Visit (HOSPITAL_COMMUNITY)
Admission: RE | Admit: 2021-02-04 | Discharge: 2021-02-04 | Disposition: A | Discharge status: Home / Self Care | Payer: Medicare Other | Source: Ambulatory Visit | Attending: Orthopaedic Surgery | Admitting: Orthopaedic Surgery

## 2021-02-04 DIAGNOSIS — Z01818 Encounter for other preprocedural examination: Secondary | ICD-10-CM | POA: Diagnosis not present

## 2021-02-04 LAB — CBC WITH DIFFERENTIAL/PLATELET
Abs Immature Granulocytes: 0.04 10*3/uL (ref 0.00–0.07)
Basophils Absolute: 0.1 10*3/uL (ref 0.0–0.1)
Basophils Relative: 1 %
Eosinophils Absolute: 0.1 10*3/uL (ref 0.0–0.5)
Eosinophils Relative: 1 %
HCT: 42.3 % (ref 36.0–46.0)
Hemoglobin: 13.6 g/dL (ref 12.0–15.0)
Immature Granulocytes: 1 %
Lymphocytes Relative: 33 %
Lymphs Abs: 2 10*3/uL (ref 0.7–4.0)
MCH: 30.1 pg (ref 26.0–34.0)
MCHC: 32.2 g/dL (ref 30.0–36.0)
MCV: 93.6 fL (ref 80.0–100.0)
Monocytes Absolute: 0.5 10*3/uL (ref 0.1–1.0)
Monocytes Relative: 8 %
Neutro Abs: 3.5 10*3/uL (ref 1.7–7.7)
Neutrophils Relative %: 56 %
Platelets: 246 10*3/uL (ref 150–400)
RBC: 4.52 MIL/uL (ref 3.87–5.11)
RDW: 13.1 % (ref 11.5–15.5)
WBC: 6.2 10*3/uL (ref 4.0–10.5)
nRBC: 0 % (ref 0.0–0.2)

## 2021-02-04 LAB — BASIC METABOLIC PANEL
Anion gap: 8 (ref 5–15)
BUN: 13 mg/dL (ref 6–20)
CO2: 24 mmol/L (ref 22–32)
Calcium: 9.1 mg/dL (ref 8.9–10.3)
Chloride: 106 mmol/L (ref 98–111)
Creatinine, Ser: 0.81 mg/dL (ref 0.44–1.00)
GFR, Estimated: >60 mL/min (ref >60)
Glucose, Bld: 94 mg/dL (ref 70–99)
Potassium: 3.9 mmol/L (ref 3.5–5.1)
Sodium: 138 mmol/L (ref 135–145)

## 2021-02-04 LAB — URINALYSIS, ROUTINE W REFLEX MICROSCOPIC
Bilirubin Urine: NEGATIVE
Glucose, UA: NEGATIVE mg/dL
Hgb urine dipstick: NEGATIVE
Ketones, ur: NEGATIVE mg/dL
Leukocytes,Ua: NEGATIVE
Nitrite: NEGATIVE
Protein, ur: NEGATIVE mg/dL
Specific Gravity, Urine: 1.006 (ref 1.005–1.030)
pH: 9 — ABNORMAL HIGH (ref 5.0–8.0)

## 2021-02-04 LAB — PROTIME-INR
INR: 1 (ref 0.8–1.2)
Prothrombin Time: 12.8 seconds (ref 11.4–15.2)

## 2021-02-04 LAB — SURGICAL PCR SCREEN
MRSA, PCR: NEGATIVE
Staphylococcus aureus: NEGATIVE

## 2021-02-04 LAB — APTT: aPTT: 28 seconds (ref 24–36)

## 2021-02-04 NOTE — Progress Notes (Addendum)
       Anesthesia Review:  PCP: DR Lavone Orn  Cardiologist : none  Neuro- 01/14/21 telephone encounter- Dr Assunta Found  LOV 02/02/2020 - Hx of closed head injury with some short term memory loss Last seizure- 2020 approx per pt  Requested clearance from office of Dr Rhona Raider  [facility] for [facility] Guiles , surgery Scheduler on 02/04/21 Chest x-ray :02/04/21 EKG :02/04/21 Echo : Stress test: Cardiac Cath :  Activity level: can do a flight of stairs wtihout difficulty  Sleep Study/ CPAP : no  Fasting Blood Sugar :      / Checks Blood Sugar -- times a day:   Blood Thinner/ Instructions /Last Dose: ASA / Instructions/ Last Dose :  U/A done 02/04/21 routed to Dr Melrose Nakayama.

## 2021-02-10 ENCOUNTER — Encounter (HOSPITAL_COMMUNITY): Payer: Self-pay | Admitting: Orthopaedic Surgery

## 2021-02-10 ENCOUNTER — Other Ambulatory Visit (HOSPITAL_COMMUNITY)
Admission: RE | Admit: 2021-02-10 | Discharge: 2021-02-10 | Disposition: A | Discharge status: Home / Self Care | Payer: Medicare Other | Source: Ambulatory Visit | Attending: Orthopaedic Surgery | Admitting: Orthopaedic Surgery

## 2021-02-10 DIAGNOSIS — Z20822 Contact with and (suspected) exposure to covid-19: Secondary | ICD-10-CM | POA: Insufficient documentation

## 2021-02-10 DIAGNOSIS — Z01812 Encounter for preprocedural laboratory examination: Secondary | ICD-10-CM | POA: Insufficient documentation

## 2021-02-10 LAB — SARS CORONAVIRUS 2 (TAT 6-24 HRS): SARS Coronavirus 2: NEGATIVE

## 2021-02-10 MED ORDER — BUPIVACAINE LIPOSOME 1.3 % IJ SUSP
20.0000 mL | Freq: Once | INTRAMUSCULAR | Status: DC
  Filled 2021-02-10: qty 20

## 2021-02-10 MED ORDER — TRANEXAMIC ACID 1000 MG/10ML IV SOLN
2000.0000 mg | INTRAVENOUS | Status: DC
  Filled 2021-02-10: qty 20
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
## 2021-02-10 NOTE — H&P (Signed)
TOTAL KNEE ADMISSION H&P  Patient is being admitted for right total knee arthroplasty.  Subjective:  Chief Complaint:right knee pain.  HPI: [name], 50 y.o. female, has a history of pain and functional disability in the right knee due to arthritis and has failed non-surgical conservative treatments for greater than 12 weeks to includeNSAID's and/or analgesics, corticosteriod injections, viscosupplementation injections, flexibility and strengthening excercises, supervised PT with diminished ADL's post treatment, use of assistive devices, weight reduction as appropriate and activity modification.  Onset of symptoms was gradual, starting 5 years ago with gradually worsening course since that time. The patient noted no past surgery on the right knee(s).  Patient currently rates pain in the right knee(s) at 10 out of 10 with activity. Patient has night pain, worsening of pain with activity and weight bearing, pain that interferes with activities of daily living, crepitus and joint swelling.  Patient has evidence of subchondral cysts, subchondral sclerosis, periarticular osteophytes and joint space narrowing by imaging studies. There is no active infection.  Patient Active Problem List   Diagnosis Date Noted  . Primary osteoarthritis of left hip 07/11/2019  . Seizure ([place])   . Closed head injury   . Hypothyroid    Past Medical History:  Diagnosis Date  . Arthritis   . Closed head injury 1995  . Endometriosis 2008  . Headache(784.0)   . HSV (herpes simplex virus) anogenital infection   . Hypothyroid   . 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 Low blood pressure   . 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 Seizure ([place])    controlled with med  . Short-term memory loss     Past Surgical History:  Procedure Laterality Date  . ABLATION ON ENDOMETRIOSIS N/A 02/13/2013   Procedure: Incision  andABLATION ON ENDOMETRIOSIS;  Surgeon: Anastasio Auerbach, MD;  Location: [facility];  Service: Gynecology;  Laterality: N/A;  Laparoscopic Ablation of Endometriosis with Peritoneal Biopsy  . HYSTEROSCOPY  2008  . LAPAROSCOPIC ASSISTED VAGINAL HYSTERECTOMY N/A 02/13/2013   Procedure: LAPAROSCOPIC ASSISTED VAGINAL HYSTERECTOMY;  Surgeon: Anastasio Auerbach, MD;  Location: [facility];  Service: Gynecology;  Laterality: N/A;  . OOPHORECTOMY  01/2008   LAP LSO, ENDO [place]  . PELVIC LAPAROSCOPY  2008  . TOTAL HIP ARTHROPLASTY Left 07/11/2019   Procedure: Left Anterior Hip Arthroplasty;  Surgeon: Melrose Nakayama, MD;  Location: [facility];  Service: Orthopedics;  Laterality: Left;  . WISDOM TOOTH EXTRACTION      Current Facility-Administered Medications  Medication Dose Route Frequency Provider Last Rate Last Admin  . [START ON 02/11/2021] bupivacaine liposome (EXPAREL) 1.3 % injection 266 mg  20 mL Other Once Lenis Noon, [facility]      . [START ON 02/11/2021] tranexamic acid (CYKLOKAPRON) 2,000 mg in sodium chloride 0.9 % 50 mL Topical Application  1,194 mg Topical To OR Lenis Noon, [facility]       Current Outpatient Medications  Medication Sig Dispense Refill Last Dose  . acetaZOLAMIDE (DIAMOX) 250 MG tablet Take 125 mg by mouth daily.     Marland Kitchen ALPRAZolam (XANAX) 0.5 MG tablet Take 0.5 mg by mouth daily as needed for anxiety.      . cholecalciferol (VITAMIN D3) 25 MCG (1000 UT) tablet Take 1,000 Units by mouth daily.     . LamoTRIgine 300 MG TB24 24 hour tablet Take 300 mg by mouth 2 (two) times  daily.     . levETIRAcetam (KEPPRA XR) 500 MG 24 hr tablet Take 1,000 mg by mouth 2 (two) times daily.      Marland Kitchen levothyroxine (SYNTHROID, LEVOTHROID) 125 MCG tablet Take 125 mcg by mouth daily.     Marland Kitchen LORazepam (ATIVAN) 0.5 MG tablet Take 0.5 mg by mouth daily as needed for seizure.     . Multiple Vitamin (MULTIVITAMIN WITH MINERALS) TABS Take 1 tablet by mouth daily.     Marland Kitchen oxcarbazepine (TRILEPTAL) 600 MG tablet Take 300  mg by mouth 2 (two) times daily.      . vitamin B-12 (CYANOCOBALAMIN) 100 MCG tablet Take 100 mcg by mouth daily.     Marland Kitchen acyclovir ointment (ZOVIRAX) 5 % Apply 1 application topically 5 (five) times daily as needed. 30 g 0   . aspirin 81 MG chewable tablet Chew 1 tablet (81 mg total) by mouth 2 (two) times daily. 60 tablet 0   . HYDROcodone-acetaminophen (NORCO/VICODIN) 5-325 MG tablet Take 1-2 tablets by mouth every 6 (six) hours as needed for moderate pain (pain score 4-6). 40 tablet 0    Allergies  Allergen Reactions  . Other Anaphylaxis    ALLERGIC TO ORAL CONTRACEPTIVES---SEIZURE.  Marland Kitchen Oxycodone     SEIZURE, but pt says she can take vicoden    Social History   Tobacco Use  . Smoking status: Never Smoker  . Smokeless tobacco: Never Used  Substance Use Topics  . Alcohol use: Yes    Alcohol/week: 0.0 standard drinks    Comment: OCCAS     Family History  Problem Relation Age of Onset  . Endometriosis Mother      Review of Systems  Musculoskeletal: Positive for arthralgias.       Right knee  All other systems reviewed and are negative.   Objective:  Physical Exam Constitutional:      Appearance: Normal appearance.  HENT:     Head: Normocephalic and atraumatic.     Mouth/Throat:     Pharynx: Oropharynx is clear.  Eyes:     Extraocular Movements: Extraocular movements intact.  Cardiovascular:     Rate and Rhythm: Normal rate and regular rhythm.  Pulmonary:     Effort: Pulmonary effort is normal.  Abdominal:     Palpations: Abdomen is soft.  Musculoskeletal:     Cervical back: Normal range of motion.     Comments: Examination of the right knee shows a mild valgus deformity.  Range of motion from 0-120 of flexion.  Some tenderness to palpation mostly laterally.  Her ligaments are stable.  Her calf is soft and nontender.  She is neurovascularly intact distally.   Skin:    General: Skin is warm and dry.  Neurological:     Mental Status: She is alert and oriented to  person, place, and time. Mental status is at baseline.  Psychiatric:        Mood and Affect: Mood normal.        Behavior: Behavior normal.        Judgment: Judgment normal.     Vital signs in last 24 hours:    Labs:   Estimated body mass index is 28.73 kg/m as calculated from the following:   Height as of 10/16/19: 5\' 6"  (1.676 m).   Weight as of 10/16/19: 80.7 kg.   Imaging Review Plain radiographs demonstrate severe degenerative joint disease of the right knee(s). The overall alignment isneutral. The bone quality appears to be good for age and reported  activity level.      Assessment/Plan:  End stage primary arthritis, right knee   The patient history, physical examination, clinical judgment of the provider and imaging studies are consistent with end stage degenerative joint disease of the right knee(s) and total knee arthroplasty is deemed medically necessary. The treatment options including medical management, injection therapy arthroscopy and arthroplasty were discussed at length. The risks and benefits of total knee arthroplasty were presented and reviewed. The risks due to aseptic loosening, infection, stiffness, patella tracking problems, thromboembolic complications and other imponderables were discussed. The patient acknowledged the explanation, agreed to proceed with the plan and consent was signed. Patient is being admitted for inpatient treatment for surgery, pain control, PT, OT, prophylactic antibiotics, VTE prophylaxis, progressive ambulation and ADL's and discharge planning. The patient is planning to be discharged home with home health services  Patient's anticipated LOS is less than 2 midnights, meeting these requirements: - Younger than 46 - Lives within 1 hour of care - Has a competent adult at home to recover with post-op recover - NO history of  - Chronic pain requiring opiods  - Diabetes  - Coronary Artery Disease  - Heart failure  - Heart attack  -  Stroke  - DVT/VTE  - Cardiac arrhythmia  - Respiratory Failure/COPD  - Renal failure  - Anemia  - Advanced Liver disease

## 2021-02-11 ENCOUNTER — Ambulatory Visit (HOSPITAL_COMMUNITY): Payer: Medicare Other | Admitting: Physician Assistant

## 2021-02-11 ENCOUNTER — Other Ambulatory Visit: Payer: Self-pay

## 2021-02-11 ENCOUNTER — Observation Stay (HOSPITAL_COMMUNITY)
Admission: RE | Admit: 2021-02-11 | Discharge: 2021-02-14 | Disposition: A | Discharge status: Home / Self Care | Payer: Medicare Other | Attending: Orthopaedic Surgery | Admitting: Orthopaedic Surgery

## 2021-02-11 ENCOUNTER — Encounter (HOSPITAL_COMMUNITY)
Admission: RE | Disposition: A | Discharge status: Home / Self Care | Payer: Self-pay | Source: Home / Self Care | Attending: Orthopaedic Surgery

## 2021-02-11 ENCOUNTER — Ambulatory Visit (HOSPITAL_COMMUNITY): Payer: Medicare Other | Admitting: Anesthesiology

## 2021-02-11 ENCOUNTER — Encounter (HOSPITAL_COMMUNITY): Payer: Self-pay | Admitting: Orthopaedic Surgery

## 2021-02-11 DIAGNOSIS — Z8669 Personal history of other diseases of the nervous system and sense organs: Secondary | ICD-10-CM | POA: Diagnosis not present

## 2021-02-11 DIAGNOSIS — Z79899 Other long term (current) drug therapy: Secondary | ICD-10-CM | POA: Insufficient documentation

## 2021-02-11 DIAGNOSIS — M1711 Unilateral primary osteoarthritis, right knee: Secondary | ICD-10-CM | POA: Diagnosis not present

## 2021-02-11 DIAGNOSIS — E039 Hypothyroidism, unspecified: Secondary | ICD-10-CM | POA: Insufficient documentation

## 2021-02-11 DIAGNOSIS — Z791 Long term (current) use of non-steroidal anti-inflammatories (NSAID): Secondary | ICD-10-CM | POA: Diagnosis not present

## 2021-02-11 DIAGNOSIS — G8918 Other acute postprocedural pain: Secondary | ICD-10-CM | POA: Diagnosis not present

## 2021-02-11 DIAGNOSIS — M25561 Pain in right knee: Secondary | ICD-10-CM | POA: Diagnosis present

## 2021-02-11 HISTORY — PX: TOTAL KNEE ARTHROPLASTY: SHX125

## 2021-02-11 LAB — TYPE AND SCREEN
ABO/RH(D): A POS
Antibody Screen: NEGATIVE

## 2021-02-11 SURGERY — ARTHROPLASTY, KNEE, TOTAL
Anesthesia: Spinal | Site: Knee | Laterality: Right

## 2021-02-11 MED ORDER — POVIDONE-IODINE 10 % EX SWAB
2.0000 "application " | Freq: Once | CUTANEOUS | Status: AC
  Administered 2021-02-11: 2 via TOPICAL

## 2021-02-11 MED ORDER — LACTATED RINGERS IV SOLN: INTRAVENOUS | Status: DC

## 2021-02-11 MED ORDER — MIDAZOLAM HCL 2 MG/2ML IJ SOLN: 0.5000 mg | Freq: Once | INTRAMUSCULAR | Status: DC | PRN

## 2021-02-11 MED ORDER — LEVETIRACETAM ER 500 MG PO TB24
1000.0000 mg | ORAL_TABLET | Freq: Two times a day (BID) | ORAL | Status: DC
  Administered 2021-02-11 – 2021-02-14 (×6): 1000 mg via ORAL
  Filled 2021-02-11 (×6): qty 2

## 2021-02-11 MED ORDER — ACETAMINOPHEN 325 MG PO TABS: 325.0000 mg | ORAL_TABLET | Freq: Four times a day (QID) | ORAL | Status: DC | PRN

## 2021-02-11 MED ORDER — KETOROLAC TROMETHAMINE 15 MG/ML IJ SOLN
INTRAMUSCULAR | Status: AC
  Filled 2021-02-11: qty 1

## 2021-02-11 MED ORDER — FENTANYL CITRATE (PF) 100 MCG/2ML IJ SOLN
INTRAMUSCULAR | Status: DC | PRN
  Administered 2021-02-11 (×2): 50 ug via INTRAVENOUS

## 2021-02-11 MED ORDER — HYDROMORPHONE HCL 1 MG/ML IJ SOLN
0.2500 mg | INTRAMUSCULAR | Status: DC | PRN
Start: 2021-02-11 — End: 2021-02-11

## 2021-02-11 MED ORDER — ONDANSETRON HCL 4 MG PO TABS
4.0000 mg | ORAL_TABLET | Freq: Four times a day (QID) | ORAL | Status: DC | PRN
  Administered 2021-02-13: 4 mg via ORAL
  Filled 2021-02-11: qty 1

## 2021-02-11 MED ORDER — ALUM & MAG HYDROXIDE-SIMETH 200-200-20 MG/5ML PO SUSP: 30.0000 mL | ORAL | Status: DC | PRN

## 2021-02-11 MED ORDER — ROPIVACAINE HCL 7.5 MG/ML IJ SOLN
INTRAMUSCULAR | Status: DC | PRN
  Administered 2021-02-11: 20 mL via PERINEURAL

## 2021-02-11 MED ORDER — LORAZEPAM 0.5 MG PO TABS: 0.5000 mg | ORAL_TABLET | Freq: Every day | ORAL | Status: DC | PRN

## 2021-02-11 MED ORDER — ONDANSETRON HCL 4 MG/2ML IJ SOLN
4.0000 mg | Freq: Four times a day (QID) | INTRAMUSCULAR | Status: DC | PRN
  Administered 2021-02-11: 4 mg via INTRAVENOUS
  Filled 2021-02-11 (×2): qty 2

## 2021-02-11 MED ORDER — PROPOFOL 500 MG/50ML IV EMUL
INTRAVENOUS | Status: DC | PRN
  Administered 2021-02-11: 120 ug/kg/min via INTRAVENOUS

## 2021-02-11 MED ORDER — MIDAZOLAM HCL 5 MG/5ML IJ SOLN
INTRAMUSCULAR | Status: DC | PRN
  Administered 2021-02-11: 2 mg via INTRAVENOUS

## 2021-02-11 MED ORDER — PROPOFOL 10 MG/ML IV BOLUS
INTRAVENOUS | Status: DC | PRN
  Administered 2021-02-11: 50 mg via INTRAVENOUS

## 2021-02-11 MED ORDER — DIPHENHYDRAMINE HCL 12.5 MG/5ML PO ELIX: 12.5000 mg | ORAL_SOLUTION | ORAL | Status: DC | PRN

## 2021-02-11 MED ORDER — METOCLOPRAMIDE HCL 5 MG PO TABS: 5.0000 mg | ORAL_TABLET | Freq: Three times a day (TID) | ORAL | Status: DC | PRN

## 2021-02-11 MED ORDER — BUPIVACAINE IN DEXTROSE 0.75-8.25 % IT SOLN
INTRATHECAL | Status: DC | PRN
  Administered 2021-02-11: 13.5 mg via INTRATHECAL

## 2021-02-11 MED ORDER — PROPOFOL 500 MG/50ML IV EMUL
INTRAVENOUS | Status: AC
  Filled 2021-02-11: qty 50

## 2021-02-11 MED ORDER — BUPIVACAINE-EPINEPHRINE (PF) 0.25% -1:200000 IJ SOLN
INTRAMUSCULAR | Status: DC | PRN
  Administered 2021-02-11: 30 mL

## 2021-02-11 MED ORDER — PHENYLEPHRINE HCL-NACL 10-0.9 MG/250ML-% IV SOLN
INTRAVENOUS | Status: DC | PRN
  Administered 2021-02-11: 25 ug/min via INTRAVENOUS

## 2021-02-11 MED ORDER — METOCLOPRAMIDE HCL 5 MG/ML IJ SOLN: 5.0000 mg | Freq: Three times a day (TID) | INTRAMUSCULAR | Status: DC | PRN

## 2021-02-11 MED ORDER — PROPOFOL 1000 MG/100ML IV EMUL
INTRAVENOUS | Status: AC
  Filled 2021-02-11: qty 200

## 2021-02-11 MED ORDER — HYDROCODONE-ACETAMINOPHEN 7.5-325 MG PO TABS
ORAL_TABLET | ORAL | Status: AC
  Administered 2021-02-11: 1 via ORAL
  Filled 2021-02-11: qty 1

## 2021-02-11 MED ORDER — MEPERIDINE HCL 50 MG/ML IJ SOLN: 6.2500 mg | INTRAMUSCULAR | Status: DC | PRN

## 2021-02-11 MED ORDER — OXCARBAZEPINE 300 MG PO TABS
300.0000 mg | ORAL_TABLET | Freq: Two times a day (BID) | ORAL | Status: DC
  Administered 2021-02-11 – 2021-02-14 (×7): 300 mg via ORAL
  Filled 2021-02-11 (×7): qty 1

## 2021-02-11 MED ORDER — LAMOTRIGINE 100 MG PO TABS
300.0000 mg | ORAL_TABLET | Freq: Two times a day (BID) | ORAL | Status: DC
  Administered 2021-02-11 – 2021-02-14 (×7): 300 mg via ORAL
  Filled 2021-02-11 (×7): qty 3

## 2021-02-11 MED ORDER — ACETAZOLAMIDE 250 MG PO TABS
125.0000 mg | ORAL_TABLET | Freq: Every day | ORAL | Status: DC
  Administered 2021-02-12 – 2021-02-14 (×3): 125 mg via ORAL
  Filled 2021-02-11 (×4): qty 1

## 2021-02-11 MED ORDER — ASPIRIN 81 MG PO CHEW
81.0000 mg | CHEWABLE_TABLET | Freq: Two times a day (BID) | ORAL | Status: DC
  Administered 2021-02-12 – 2021-02-14 (×5): 81 mg via ORAL
  Filled 2021-02-11 (×5): qty 1

## 2021-02-11 MED ORDER — ACETAMINOPHEN 500 MG PO TABS
1000.0000 mg | ORAL_TABLET | Freq: Once | ORAL | Status: AC
  Administered 2021-02-11: 1000 mg via ORAL
  Filled 2021-02-11: qty 2

## 2021-02-11 MED ORDER — OXYCODONE HCL 5 MG PO TABS: 5.0000 mg | ORAL_TABLET | Freq: Once | ORAL | Status: DC | PRN

## 2021-02-11 MED ORDER — HYDROCODONE-ACETAMINOPHEN 5-325 MG PO TABS
1.0000 | ORAL_TABLET | ORAL | Status: DC | PRN
  Administered 2021-02-11 – 2021-02-12 (×3): 2 via ORAL
  Filled 2021-02-11 (×3): qty 2

## 2021-02-11 MED ORDER — MORPHINE SULFATE (PF) 2 MG/ML IV SOLN
0.5000 mg | INTRAVENOUS | Status: DC | PRN
  Administered 2021-02-13 (×2): 1 mg via INTRAVENOUS
  Filled 2021-02-11 (×2): qty 1

## 2021-02-11 MED ORDER — TRANEXAMIC ACID-NACL 1000-0.7 MG/100ML-% IV SOLN
1000.0000 mg | INTRAVENOUS | Status: AC
  Administered 2021-02-11: 1000 mg via INTRAVENOUS
  Filled 2021-02-11: qty 100

## 2021-02-11 MED ORDER — PHENYLEPHRINE HCL (PRESSORS) 10 MG/ML IV SOLN
INTRAVENOUS | Status: AC
  Filled 2021-02-11: qty 8

## 2021-02-11 MED ORDER — MIDAZOLAM HCL 2 MG/2ML IJ SOLN
INTRAMUSCULAR | Status: AC
  Filled 2021-02-11: qty 2

## 2021-02-11 MED ORDER — ALPRAZOLAM 0.5 MG PO TABS
0.5000 mg | ORAL_TABLET | Freq: Every day | ORAL | Status: DC | PRN
  Administered 2021-02-12 – 2021-02-13 (×2): 0.5 mg via ORAL
  Filled 2021-02-11 (×2): qty 1

## 2021-02-11 MED ORDER — PHENOL 1.4 % MT LIQD: 1.0000 | OROMUCOSAL | Status: DC | PRN

## 2021-02-11 MED ORDER — DOCUSATE SODIUM 100 MG PO CAPS
100.0000 mg | ORAL_CAPSULE | Freq: Two times a day (BID) | ORAL | Status: DC
Start: 2021-02-11 — End: 2021-02-14
  Administered 2021-02-11 – 2021-02-14 (×6): 100 mg via ORAL
  Filled 2021-02-11 (×6): qty 1

## 2021-02-11 MED ORDER — BISACODYL 5 MG PO TBEC
5.0000 mg | DELAYED_RELEASE_TABLET | Freq: Every day | ORAL | Status: DC | PRN
  Administered 2021-02-14: 5 mg via ORAL
  Filled 2021-02-11: qty 1

## 2021-02-11 MED ORDER — PROMETHAZINE HCL 25 MG/ML IJ SOLN
6.2500 mg | INTRAMUSCULAR | Status: DC | PRN
Start: 2021-02-11 — End: 2021-02-11

## 2021-02-11 MED ORDER — METHOCARBAMOL 500 MG IVPB - SIMPLE MED
500.0000 mg | Freq: Four times a day (QID) | INTRAVENOUS | Status: DC | PRN
  Filled 2021-02-11: qty 50

## 2021-02-11 MED ORDER — BUPIVACAINE-EPINEPHRINE (PF) 0.25% -1:200000 IJ SOLN
INTRAMUSCULAR | Status: AC
  Filled 2021-02-11: qty 30

## 2021-02-11 MED ORDER — KETOROLAC TROMETHAMINE 15 MG/ML IJ SOLN
15.0000 mg | Freq: Four times a day (QID) | INTRAMUSCULAR | Status: AC
Start: 2021-02-11 — End: 2021-02-12
  Administered 2021-02-11 – 2021-02-12 (×4): 15 mg via INTRAVENOUS
  Filled 2021-02-11 (×3): qty 1

## 2021-02-11 MED ORDER — METHOCARBAMOL 500 MG PO TABS
500.0000 mg | ORAL_TABLET | Freq: Four times a day (QID) | ORAL | Status: DC | PRN
  Administered 2021-02-11 – 2021-02-13 (×7): 500 mg via ORAL
  Filled 2021-02-11 (×10): qty 1

## 2021-02-11 MED ORDER — ONDANSETRON HCL 4 MG/2ML IJ SOLN
INTRAMUSCULAR | Status: DC | PRN
  Administered 2021-02-11: 4 mg via INTRAVENOUS

## 2021-02-11 MED ORDER — OXYCODONE HCL 5 MG/5ML PO SOLN: 5.0000 mg | Freq: Once | ORAL | Status: DC | PRN

## 2021-02-11 MED ORDER — ACETAMINOPHEN 500 MG PO TABS
500.0000 mg | ORAL_TABLET | Freq: Four times a day (QID) | ORAL | Status: AC
  Administered 2021-02-12: 500 mg via ORAL
  Filled 2021-02-11: qty 1

## 2021-02-11 MED ORDER — BUPIVACAINE LIPOSOME 1.3 % IJ SUSP
INTRAMUSCULAR | Status: DC | PRN
  Administered 2021-02-11: 20 mL

## 2021-02-11 MED ORDER — FENTANYL CITRATE (PF) 100 MCG/2ML IJ SOLN
INTRAMUSCULAR | Status: AC
  Filled 2021-02-11: qty 2

## 2021-02-11 MED ORDER — LEVOTHYROXINE SODIUM 125 MCG PO TABS
125.0000 ug | ORAL_TABLET | Freq: Every day | ORAL | Status: DC
  Administered 2021-02-12 – 2021-02-14 (×3): 125 ug via ORAL
  Filled 2021-02-11 (×3): qty 1

## 2021-02-11 MED ORDER — CEFAZOLIN SODIUM-DEXTROSE 2-4 GM/100ML-% IV SOLN
2.0000 g | Freq: Four times a day (QID) | INTRAVENOUS | Status: AC
  Administered 2021-02-11 (×2): 2 g via INTRAVENOUS
  Filled 2021-02-11 (×2): qty 100

## 2021-02-11 MED ORDER — 0.9 % SODIUM CHLORIDE (POUR BTL) OPTIME
TOPICAL | Status: DC | PRN
  Administered 2021-02-11: 1000 mL

## 2021-02-11 MED ORDER — BUPIVACAINE-EPINEPHRINE 0.5% -1:200000 IJ SOLN
INTRAMUSCULAR | Status: AC
  Filled 2021-02-11: qty 1

## 2021-02-11 MED ORDER — SODIUM CHLORIDE (PF) 0.9 % IJ SOLN
INTRAMUSCULAR | Status: DC | PRN
  Administered 2021-02-11: 30 mL

## 2021-02-11 MED ORDER — TRANEXAMIC ACID-NACL 1000-0.7 MG/100ML-% IV SOLN
1000.0000 mg | Freq: Once | INTRAVENOUS | Status: AC
  Administered 2021-02-11: 1000 mg via INTRAVENOUS
  Filled 2021-02-11: qty 100

## 2021-02-11 MED ORDER — MENTHOL 3 MG MT LOZG: 1.0000 | LOZENGE | OROMUCOSAL | Status: DC | PRN

## 2021-02-11 MED ORDER — DEXAMETHASONE SODIUM PHOSPHATE 10 MG/ML IJ SOLN
INTRAMUSCULAR | Status: DC | PRN
  Administered 2021-02-11: 8 mg via INTRAVENOUS

## 2021-02-11 MED ORDER — STERILE WATER FOR IRRIGATION IR SOLN
Status: DC | PRN
  Administered 2021-02-11: 2000 mL

## 2021-02-11 MED ORDER — TRANEXAMIC ACID 1000 MG/10ML IV SOLN
INTRAVENOUS | Status: DC | PRN
  Administered 2021-02-11: 2000 mg via TOPICAL

## 2021-02-11 MED ORDER — PROPOFOL 10 MG/ML IV BOLUS
INTRAVENOUS | Status: AC
  Filled 2021-02-11: qty 20

## 2021-02-11 MED ORDER — CEFAZOLIN SODIUM-DEXTROSE 2-4 GM/100ML-% IV SOLN
2.0000 g | INTRAVENOUS | Status: AC
  Administered 2021-02-11: 2 g via INTRAVENOUS
  Filled 2021-02-11: qty 100

## 2021-02-11 MED ORDER — HYDROCODONE-ACETAMINOPHEN 7.5-325 MG PO TABS
1.0000 | ORAL_TABLET | ORAL | Status: DC | PRN
  Administered 2021-02-12 – 2021-02-14 (×10): 2 via ORAL
  Administered 2021-02-14: 1 via ORAL
  Administered 2021-02-14 (×2): 2 via ORAL
  Filled 2021-02-11 (×11): qty 2
  Filled 2021-02-11: qty 1
  Filled 2021-02-11 (×2): qty 2
  Filled 2021-02-11: qty 1

## 2021-02-11 SURGICAL SUPPLY — 54 items
ATTUNE PS FEM RT SZ 4 CEM KNEE (Femur) ×1 IMPLANT
ATTUNE PSRP INSR SZ4 5 KNEE (Insert) ×1 IMPLANT
BAG DECANTER FOR FLEXI CONT (MISCELLANEOUS) ×2 IMPLANT
BAG SPEC THK2 15X12 ZIP CLS (MISCELLANEOUS) ×1
BAG ZIPLOCK 12X15 (MISCELLANEOUS) ×2 IMPLANT
BASE TIBIAL ROT PLAT SZ 5 KNEE (Knees) IMPLANT
BLADE SAGITTAL 25.0X1.19X90 (BLADE) ×2 IMPLANT
BLADE SAW SGTL 11.0X1.19X90.0M (BLADE) ×2 IMPLANT
BNDG ELASTIC 6X5.8 VLCR STR LF (GAUZE/BANDAGES/DRESSINGS) ×2 IMPLANT
BOOTIES KNEE HIGH SLOAN (MISCELLANEOUS) ×2 IMPLANT
BOWL SMART MIX CTS (DISPOSABLE) ×2 IMPLANT
BSPLAT TIB 5 CMNT ROT PLAT STR (Knees) ×1 IMPLANT
CEMENT HV SMART SET (Cement) ×4 IMPLANT
COVER SURGICAL LIGHT HANDLE (MISCELLANEOUS) ×2 IMPLANT
COVER WAND RF STERILE (DRAPES) ×2 IMPLANT
CUFF TOURN SGL QUICK 34 (TOURNIQUET CUFF) ×2
CUFF TRNQT CYL 34X4.125X (TOURNIQUET CUFF) ×1 IMPLANT
DECANTER SPIKE VIAL GLASS SM (MISCELLANEOUS) ×4 IMPLANT
DRAPE ORTHO SPLIT 77X108 STRL (DRAPES)
DRAPE SHEET LG 3/4 BI-LAMINATE (DRAPES) ×3 IMPLANT
DRAPE SURG ORHT 6 SPLT 77X108 (DRAPES) IMPLANT
DRAPE TOP 10253 STERILE (DRAPES) ×2 IMPLANT
DRAPE U-SHAPE 47X51 STRL (DRAPES) ×2 IMPLANT
DRSG AQUACEL AG ADV 3.5X10 (GAUZE/BANDAGES/DRESSINGS) ×2 IMPLANT
DURAPREP 26ML APPLICATOR (WOUND CARE) ×4 IMPLANT
ELECT REM PT RETURN 15FT ADLT (MISCELLANEOUS) ×2 IMPLANT
GLOVE SRG 8 PF TXTR STRL LF DI (GLOVE) ×2 IMPLANT
GLOVE SURG ENC MOIS LTX SZ8 (GLOVE) ×4 IMPLANT
GLOVE SURG UNDER POLY LF SZ8 (GLOVE) ×4
GOWN STRL REUS W/TWL XL LVL3 (GOWN DISPOSABLE) ×4 IMPLANT
HANDPIECE INTERPULSE COAX TIP (DISPOSABLE) ×2
HOLDER FOLEY CATH W/STRAP (MISCELLANEOUS) ×1 IMPLANT
HOOD PEEL AWAY FLYTE STAYCOOL (MISCELLANEOUS) ×6 IMPLANT
KIT TURNOVER KIT A (KITS) ×2 IMPLANT
MANIFOLD NEPTUNE II (INSTRUMENTS) ×2 IMPLANT
NEEDLE HYPO 22GX1.5 SAFETY (NEEDLE) ×1 IMPLANT
NS IRRIG 1000ML POUR BTL (IV SOLUTION) ×2 IMPLANT
PACK TOTAL KNEE CUSTOM (KITS) ×2 IMPLANT
PAD ARMBOARD 7.5X6 YLW CONV (MISCELLANEOUS) ×2 IMPLANT
PATELLA MEDIAL ATTUN 35MM KNEE (Knees) ×1 IMPLANT
PENCIL SMOKE EVACUATOR (MISCELLANEOUS) ×1 IMPLANT
PIN DRILL FIX HALF THREAD (BIT) ×1 IMPLANT
PIN STEINMAN FIXATION KNEE (PIN) ×1 IMPLANT
PROTECTOR NERVE ULNAR (MISCELLANEOUS) ×2 IMPLANT
SET HNDPC FAN SPRY TIP SCT (DISPOSABLE) ×1 IMPLANT
SUT ETHIBOND NAB CT1 #1 30IN (SUTURE) ×4 IMPLANT
SUT VIC AB 0 CT1 36 (SUTURE) ×2 IMPLANT
SUT VIC AB 2-0 CT1 27 (SUTURE) ×2
SUT VIC AB 2-0 CT1 TAPERPNT 27 (SUTURE) ×1 IMPLANT
SUT VICRYL AB 3-0 FS1 BRD 27IN (SUTURE) ×2 IMPLANT
TIBIAL BASE ROT PLAT SZ 5 KNEE (Knees) ×2 IMPLANT
TRAY FOLEY MTR SLVR 14FR STAT (SET/KITS/TRAYS/PACK) ×1 IMPLANT
WATER STERILE IRR 1000ML POUR (IV SOLUTION) ×2 IMPLANT
WRAP KNEE MAXI GEL POST OP (GAUZE/BANDAGES/DRESSINGS) ×2 IMPLANT

## 2021-02-11 NOTE — Anesthesia Postprocedure Evaluation (Signed)
Anesthesia Post Note  Patient: [name]  Procedure(s) Performed: RIGHT TOTAL KNEE ARTHROPLASTY (Right Knee)     Patient location during evaluation: PACU Anesthesia Type: Spinal Level of consciousness: awake and alert, patient cooperative and oriented Pain management: pain level controlled Vital Signs Assessment: post-procedure vital signs reviewed and stable Respiratory status: spontaneous breathing, nonlabored ventilation and respiratory function stable Cardiovascular status: blood pressure returned to baseline and stable Postop Assessment: no apparent nausea or vomiting, adequate PO intake and spinal receding Anesthetic complications: no   No complications documented.  Last Vitals:  Vitals:   02/11/21 1030 02/11/21 1045  BP: 110/74 117/72  Pulse: 85 81  Resp: 12 (!) 9  Temp:    SpO2: 100% 100%    Last Pain:  Vitals:   02/11/21 1030  TempSrc:   PainSc: 0-No pain                 Ajee Heasley,E. Carlus Stay

## 2021-02-11 NOTE — Transfer of Care (Signed)
Immediate Anesthesia Transfer of Care Note  Patient: [name]  Procedure(s) Performed: Procedure(s): RIGHT TOTAL KNEE ARTHROPLASTY (Right)  Patient Location: PACU  Anesthesia Type:Spinal  Level of Consciousness:  sedated, patient cooperative and responds to stimulation  Airway & Oxygen Therapy:Patient Spontanous Breathing and Patient connected to face mask oxgen  Post-op Assessment:  Report given to PACU RN and Post -op Vital signs reviewed and stable  Post vital signs:  Reviewed and stable  Last Vitals:  Vitals:   02/11/21 0645  BP: 116/80  Pulse: 90  Resp: 16  Temp: 36.7 C  SpO2: 82%    Complications: No apparent anesthesia complications

## 2021-02-11 NOTE — Op Note (Signed)
PREOP DIAGNOSIS: DJD RIGHT KNEE POSTOP DIAGNOSIS: same PROCEDURE: RIGHT TKR ANESTHESIA: Spinal and MAC ATTENDING SURGEON: Hessie Dibble ASSISTANT: Loni Dolly PA  INDICATIONS FOR PROCEDURE: [name] is a 50 y.o. female who has struggled for a long time with pain due to degenerative arthritis of the right knee.  The patient has failed many conservative non-operative measures and at this point has pain which limits the ability to sleep and walk.  The patient is offered total knee replacement.  Informed operative consent was obtained after discussion of possible risks of anesthesia, infection, neurovascular injury, DVT, and death.  The importance of the post-operative rehabilitation protocol to optimize result was stressed extensively with the patient.  SUMMARY OF FINDINGS AND PROCEDURE:  THURSA EMME was taken to the operative suite where under the above anesthesia a right knee replacement was performed.  There were advanced degenerative changes and the bone quality was good.  We used the DePuy Attune system and placed size 4 femur, 5 tibia, 35 mm all polyethylene patella, and a size 5 mm spacer.  Loni Dolly PA-C assisted throughout and was invaluable to the completion of the case in that he helped retract and maintain exposure while I placed components.  He also helped close thereby minimizing OR time.  The patient was admitted for appropriate post-op care to include perioperative antibiotics and mechanical and pharmacologic measures for DVT prophylaxis.  DESCRIPTION OF PROCEDURE:  RYLEAH Holloway was taken to the operative suite where the above anesthesia was applied.  The patient was positioned supine and prepped and draped in normal sterile fashion.  An appropriate time out was performed.  After the administration of kefzol pre-op antibiotic the leg was elevated and exsanguinated and a tourniquet inflated. A standard longitudinal incision was made on the anterior knee.   Dissection was carried down to the extensor mechanism.  All appropriate anti-infective measures were used including the pre-operative antibiotic, betadine impregnated drape, and closed hooded exhaust systems for each member of the surgical team.  A medial parapatellar incision was made in the extensor mechanism and the knee cap flipped and the knee flexed.  Some residual meniscal tissues were removed along with any remaining ACL/PCL tissue.  A guide was placed on the tibia and a flat cut was made on it's superior surface.  An intramedullary guide was placed in the femur and was utilized to make anterior and posterior cuts creating an appropriate flexion gap.  A second intramedullary guide was placed in the femur to make a distal cut properly balancing the knee with an extension gap equal to the flexion gap.  The three bones sized to the above mentioned sizes and the appropriate guides were placed and utilized.  A trial reduction was done and the knee easily came to full extension and the patella tracked well on flexion.  The trial components were removed and all bones were cleaned with pulsatile lavage and then dried thoroughly.  Cement was mixed and was pressurized onto the bones followed by placement of the aforementioned components.  Excess cement was trimmed and pressure was held on the components until the cement had hardened.  The tourniquet was deflated and a small amount of bleeding was controlled with cautery and pressure.  The knee was irrigated thoroughly.  The extensor mechanism was re-approximated with #1 ethibond in interrupted fashion.  The knee was flexed and the repair was solid.  The subcutaneous tissues were re-approximated with #0 and #2-0 vicryl and the skin closed with  a subcuticular stitch and steristrips.  A sterile dressing was applied.  Intraoperative fluids, EBL, and tourniquet time can be obtained from anesthesia records.  DISPOSITION:  The patient was taken to recovery room in stable  condition and admitted for appropriate post-op care to include peri-operative antibiotic and DVT prophylaxis with mechanical and pharmacologic measures.  Hessie Dibble 02/11/2021, 9:20 AM

## 2021-02-11 NOTE — Evaluation (Signed)
Physical Therapy Evaluation Patient Details Name: [name] MRN: [id number] DOB: 04-10-1971 Today's Date: 02/11/2021   History of Present Illness  50 y.o. female admitted for R TKA on 02/11/21. 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  Clinical Impression  Pt is s/p TKA resulting in the deficits listed below (see PT Problem List). Pt ambulated 8' with RW, distance limited by RLE buckling. Initiated TKA HEP. Good progress expected.  Pt will benefit from skilled PT to increase their independence and safety with mobility to allow discharge to the venue listed below.      Follow Up Recommendations Follow surgeon's recommendation for DC plan and follow-up therapies;Home health PT    Equipment Recommendations  None recommended by PT    Recommendations for Other Services       Precautions / Restrictions Precautions Precautions: Knee;Fall Precaution Comments: reviewed no pillow under knee; pt reports 1 fall in past year due to slipping on ice Restrictions Weight Bearing Restrictions: No Other Position/Activity Restrictions: WBAT      Mobility  Bed Mobility Overal bed mobility: Needs Assistance Bed Mobility: Supine to Sit     Supine to sit: Min assist     General bed mobility comments: assist to support RLE    Transfers Overall transfer level: Needs assistance Equipment used: Rolling walker (2 wheeled) Transfers: Sit to/from Stand Sit to Stand: Min assist;From elevated surface         General transfer comment: VCs hand placement, min A to power up  Ambulation/Gait Ambulation/Gait assistance: Min assist Gait Distance (Feet): 8 Feet Assistive device: Rolling walker (2 wheeled) Gait Pattern/deviations: Step-to pattern;Decreased step length - right;Decreased step length - left Gait velocity: decr   General Gait Details: min A for balance,  buckling of RLE with weight bearing  Stairs            Wheelchair Mobility    Modified  Rankin (Stroke Patients Only)       Balance Overall balance assessment: Needs assistance Sitting-balance support: Feet supported Sitting balance-Leahy Scale: Fair     Standing balance support: Bilateral upper extremity supported Standing balance-Leahy Scale: Poor                               Pertinent Vitals/Pain Pain Assessment: 0-10 Pain Score: 5  Pain Location: R knee with movement Pain Descriptors / Indicators: Sore Pain Intervention(s): Limited activity within patient's tolerance;Monitored during session;Premedicated before session;Ice applied;Repositioned    Home Living Family/patient expects to be discharged to:: Private residence Living Arrangements: Spouse/significant other Available Help at Discharge: (P) Family;Available 24 hours/day Type of Home: (P) House Home Access: Stairs to enter Entrance Stairs-Rails: Left Entrance Stairs-Number of Steps: 2 Home Layout: Two level;Able to live on main level with bedroom/bathroom Home Equipment: Shower seat;Walker - 2 wheels;Bedside commode      Prior Function Level of Independence: Independent               Hand Dominance        Extremity/Trunk Assessment   Upper Extremity Assessment Upper Extremity Assessment: Overall WFL for tasks assessed    Lower Extremity Assessment Lower Extremity Assessment: RLE deficits/detail RLE Deficits / Details: SLR +2/5, knee AAROM 5-55* RLE Sensation: WNL RLE Coordination: WNL    Cervical / Trunk Assessment Cervical / Trunk Assessment: Normal  Communication   Communication: No difficulties  Cognition Arousal/Alertness: Awake/alert Behavior During Therapy: WFL for tasks assessed/performed Overall Cognitive  Status: History of cognitive impairments - at baseline (short term memory loss 2* h/o TBI)                                 General Comments: alert and oriented x 3      General Comments      Exercises Total Joint Exercises Ankle  Circles/Pumps: AROM;10 reps;Both;Supine Heel Slides: AAROM;Right;10 reps;Supine Straight Leg Raises: AAROM;Right;5 reps;Supine   Assessment/Plan    PT Assessment Patient needs continued PT services  PT Problem List Decreased strength;Decreased range of motion;Decreased activity tolerance;Decreased balance;Pain;Decreased mobility       PT Treatment Interventions DME instruction;Gait training;Stair training;Therapeutic exercise;Therapeutic activities;Patient/family education;Balance training    PT Goals (Current goals can be found in the Care Plan section)  Acute Rehab PT Goals Patient Stated Goal: be able to walk and exercise PT Goal Formulation: With patient Time For Goal Achievement: 02/18/21 Potential to Achieve Goals: Good    Frequency 7X/week   Barriers to discharge        Co-evaluation               AM-PAC PT "6 Clicks" Mobility  Outcome Measure Help needed turning from your back to your side while in a flat bed without using bedrails?: A Little Help needed moving from lying on your back to sitting on the side of a flat bed without using bedrails?: A Little Help needed moving to and from a bed to a chair (including a wheelchair)?: A Little Help needed standing up from a chair using your arms (e.g., wheelchair or bedside chair)?: A Little Help needed to walk in hospital room?: A Little Help needed climbing 3-5 steps with a railing? : A Lot 6 Click Score: 17    End of Session Equipment Utilized During Treatment: Gait belt Activity Tolerance: Patient limited by pain;Patient tolerated treatment well Patient left: in chair;with call bell/phone within reach;with chair alarm set Nurse Communication: Mobility status PT Visit Diagnosis: Difficulty in walking, not elsewhere classified (R26.2);Pain Pain - Right/Left: Right Pain - part of body: Knee    Time: 6440-3474 PT Time Calculation (min) (ACUTE ONLY): 45 min   Charges:   PT Evaluation $PT Eval Low Complexity:  1 Low PT Treatments $Gait Training: 8-22 mins $Therapeutic Exercise: 8-22 mins        Blondell Reveal Kistler PT 02/11/2021  Acute Rehabilitation Services Pager [phone] Office [phone]

## 2021-02-11 NOTE — Anesthesia Procedure Notes (Addendum)
Spinal  Patient location during procedure: OR End time: 02/11/2021 7:34 AM Staffing Performed: anesthesiologist  Anesthesiologist: Annye Asa, MD Preanesthetic Checklist Completed: patient identified, IV checked, site marked, risks and benefits discussed, surgical consent, monitors and equipment checked, pre-op evaluation and timeout performed Spinal Block Patient position: sitting Prep: DuraPrep and site prepped and draped Patient monitoring: blood pressure, continuous pulse ox, cardiac monitor and heart rate Approach: midline Location: L3-4 Injection technique: single-shot Needle Needle type: Pencan and Introducer  Needle gauge: 24 G Needle length: 9 cm Additional Notes Pt identified in Operating room.  Monitors applied. Working IV access confirmed. Sterile prep, drape lumbar spine.  1% lido local L 3,4.  #24ga Pencan into clear CSF L 3,4.  13.5mg  0.75% Bupivacaine with dextrose injected with asp CSF beginning and end of injection.  Patient asymptomatic, VSS, no heme aspirated, tolerated well.  Jenita Seashore, MD

## 2021-02-11 NOTE — Anesthesia Procedure Notes (Signed)
Anesthesia Regional Block: Adductor canal block   Pre-Anesthetic Checklist: ,, timeout performed, Correct Patient, Correct Site, Correct Laterality, Correct Procedure, Correct Position, site marked, Risks and benefits discussed,  Surgical consent,  Pre-op evaluation,  At surgeon's request and post-op pain management  Laterality: Right and Lower  Prep: chloraprep       Needles:  Injection technique: Single-shot  Needle Type: Echogenic Needle     Needle Length: 9cm  Needle Gauge: 21     Additional Needles:   Procedures:,,,, ultrasound used (permanent image in chart),,,,  Narrative:  Start time: 02/11/2021 6:56 AM End time: 02/11/2021 7:02 AM Injection made incrementally with aspirations every 5 mL.  Performed by: Personally  Anesthesiologist: Annye Asa, MD  Additional Notes: Pt identified in Holding room.  Monitors applied. Working IV access confirmed. Sterile prep R thigh.  #21ga ECHOgenic block needle into adductor canal with US guidance.  20cc 0.75% Ropivacaine injected incrementally after negative test dose.  Patient asymptomatic, VSS, no heme aspirated, tolerated well.  Jenita Seashore, MD

## 2021-02-11 NOTE — Interval H&P Note (Signed)
History and Physical Interval Note:  02/11/2021 7:25 AM  [name]  has presented today for surgery, with the diagnosis of RIGHT KNEE DEGENERATIVE JOINT DISEASE.  The various methods of treatment have been discussed with the patient and family. After consideration of risks, benefits and other options for treatment, the patient has consented to  Procedure(s): RIGHT TOTAL KNEE ARTHROPLASTY (Right) as a surgical intervention.  The patient's history has been reviewed, patient examined, no change in status, stable for surgery.  I have reviewed the patient's chart and labs.  Questions were answered to the patient's satisfaction.     Hessie Dibble

## 2021-02-12 ENCOUNTER — Encounter (HOSPITAL_COMMUNITY): Payer: Self-pay | Admitting: Orthopaedic Surgery

## 2021-02-12 DIAGNOSIS — Z79899 Other long term (current) drug therapy: Secondary | ICD-10-CM | POA: Diagnosis not present

## 2021-02-12 DIAGNOSIS — M1711 Unilateral primary osteoarthritis, right knee: Secondary | ICD-10-CM | POA: Diagnosis not present

## 2021-02-12 DIAGNOSIS — Z791 Long term (current) use of non-steroidal anti-inflammatories (NSAID): Secondary | ICD-10-CM | POA: Diagnosis not present

## 2021-02-12 DIAGNOSIS — Z8669 Personal history of other diseases of the nervous system and sense organs: Secondary | ICD-10-CM | POA: Diagnosis not present

## 2021-02-12 DIAGNOSIS — E039 Hypothyroidism, unspecified: Secondary | ICD-10-CM | POA: Diagnosis not present

## 2021-02-12 MED ORDER — HYDROCODONE-ACETAMINOPHEN 5-325 MG PO TABS: 1.0000 | ORAL_TABLET | Freq: Four times a day (QID) | ORAL | 0 refills | Status: AC | PRN

## 2021-02-12 MED ORDER — TIZANIDINE HCL 4 MG PO TABS: 4.0000 mg | ORAL_TABLET | Freq: Four times a day (QID) | ORAL | 1 refills | Status: AC | PRN

## 2021-02-12 MED ORDER — ASPIRIN 81 MG PO CHEW: 81.0000 mg | CHEWABLE_TABLET | Freq: Two times a day (BID) | ORAL | 0 refills | Status: AC

## 2021-02-12 MED ORDER — SODIUM CHLORIDE 0.9 % IV BOLUS
500.0000 mL | Freq: Once | INTRAVENOUS | Status: AC
  Administered 2021-02-12: 500 mL via INTRAVENOUS

## 2021-02-12 NOTE — Progress Notes (Signed)
Physical Therapy Treatment Patient Details Name: [name] MRN: [id number] DOB: 1971-06-09 Today's Date: 02/12/2021    History of Present Illness 50 y.o. female admitted for R TKA on 02/11/21. 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    PT Comments    Pt ambulated 8' with RW, distance limited by therapist 2* HR 161 while walking. Pt denied anxiety and severe pain. After several minutes seated rest, HR down to 130, RN notified. Instructed pt in TKA HEP. Will return for second session this afternoon. Pt will need to be able to ambulate farther and complete stair training in order to be ready to DC home.    Follow Up Recommendations  Follow surgeon's recommendation for DC plan and follow-up therapies;Home health PT     Equipment Recommendations  None recommended by PT    Recommendations for Other Services       Precautions / Restrictions Precautions Precautions: Knee;Fall Precaution Booklet Issued: Yes (comment) Precaution Comments: reviewed no pillow under knee; pt reports 1 fall in past year due to slipping on ice Restrictions Weight Bearing Restrictions: No RLE Weight Bearing: Weight bearing as tolerated Other Position/Activity Restrictions: WBAT    Mobility  Bed Mobility Overal bed mobility: Modified Independent Bed Mobility: Supine to Sit           General bed mobility comments: used gait belt as RLE leg lifter    Transfers Overall transfer level: Needs assistance Equipment used: Rolling walker (2 wheeled) Transfers: Sit to/from Stand Sit to Stand: Min guard;From elevated surface         General transfer comment: VCs hand placement  Ambulation/Gait Ambulation/Gait assistance: Min guard Gait Distance (Feet): 8 Feet Assistive device: Rolling walker (2 wheeled) Gait Pattern/deviations: Step-to pattern;Decreased step length - right;Decreased step length - left Gait velocity: decr   General Gait Details: VCs sequencing,  distance limited by PT 2* HR up to 161 while walking, after several minutes seated rest HR down to 130, RN notified   Stairs             Wheelchair Mobility    Modified Rankin (Stroke Patients Only)       Balance Overall balance assessment: Needs assistance Sitting-balance support: Feet supported Sitting balance-Leahy Scale: Fair     Standing balance support: Bilateral upper extremity supported Standing balance-Leahy Scale: Poor                              Cognition Arousal/Alertness: Awake/alert Behavior During Therapy: WFL for tasks assessed/performed Overall Cognitive Status: History of cognitive impairments - at baseline (short term memory loss 2* h/o TBI)                                 General Comments: alert and oriented x 3      Exercises Total Joint Exercises Ankle Circles/Pumps: AROM;10 reps;Both;Supine Quad Sets: AROM;Both;5 reps;Supine Short Arc Quad: AROM;Right;5 reps;Supine Heel Slides: AAROM;Right;10 reps;Supine Hip ABduction/ADduction: AROM;Right;5 reps;Supine Straight Leg Raises: AAROM;Right;5 reps;Supine;AROM Goniometric ROM: 5-55* AAROm R knee    General Comments        Pertinent Vitals/Pain Pain Score: 5  Pain Location: R knee with movement Pain Descriptors / Indicators: Sore Pain Intervention(s): Limited activity within patient's tolerance;Monitored during session;Premedicated before session;Ice applied    Home Living  Prior Function            PT Goals (current goals can now be found in the care plan section) Acute Rehab PT Goals Patient Stated Goal: be able to walk dog and exercise PT Goal Formulation: With patient Time For Goal Achievement: 02/18/21 Potential to Achieve Goals: Good Progress towards PT goals: Progressing toward goals    Frequency    7X/week      PT Plan Current plan remains appropriate    Co-evaluation              AM-PAC PT "6  Clicks" Mobility   Outcome Measure  Help needed turning from your back to your side while in a flat bed without using bedrails?: A Little Help needed moving from lying on your back to sitting on the side of a flat bed without using bedrails?: A Little Help needed moving to and from a bed to a chair (including a wheelchair)?: A Little Help needed standing up from a chair using your arms (e.g., wheelchair or bedside chair)?: A Little Help needed to walk in hospital room?: A Little Help needed climbing 3-5 steps with a railing? : A Lot 6 Click Score: 17    End of Session Equipment Utilized During Treatment: Gait belt Activity Tolerance: Treatment limited secondary to medical complications (Comment) (tachycardia with activity) Patient left: in chair;with call bell/phone within reach;with chair alarm set Nurse Communication: Mobility status PT Visit Diagnosis: Difficulty in walking, not elsewhere classified (R26.2);Pain Pain - Right/Left: Right Pain - part of body: Knee     Time: 4627-0350 PT Time Calculation (min) (ACUTE ONLY): 29 min  Charges:  $Gait Training: 8-22 mins $Therapeutic Exercise: 8-22 mins                    Blondell Reveal Kistler PT 02/12/2021  Acute Rehabilitation Services Pager [phone] Office [phone]

## 2021-02-12 NOTE — Discharge Summary (Addendum)
Patient ID: [name] MRN: [id number] DOB/AGE: 1971/07/08 50 y.o.  Admit date: 02/11/2021 Discharge date: 02/14/2021  Admission Diagnoses:  Principal Problem:   Primary osteoarthritis of right knee   Discharge Diagnoses:  Same  Past Medical History:  Diagnosis Date  . Arthritis   . Closed head injury 1995  . Endometriosis 2008  . Headache(784.0)   . HSV (herpes simplex virus) anogenital infection   . Hypothyroid   . 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 Low blood pressure   . 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 Seizure ([place])    controlled with med  . Short-term memory loss     Surgeries: Procedure(s): RIGHT TOTAL KNEE ARTHROPLASTY on 02/11/2021   Consultants:   Discharged Condition: Improved  Hospital Course: [name] is an 50 y.o. female who was admitted 02/11/2021 for operative treatment ofPrimary osteoarthritis of right knee. Patient has severe unremitting pain that affects sleep, daily activities, and work/hobbies. After pre-op clearance the patient was taken to the operating room on 02/11/2021 and underwent  Procedure(s): RIGHT TOTAL KNEE ARTHROPLASTY.    Patient was given perioperative antibiotics:  Anti-infectives (From admission, onward)   Start     Dose/Rate Route Frequency Ordered Stop   02/11/21 1330  ceFAZolin (ANCEF) IVPB 2g/100 mL premix        2 g 200 mL/hr over 30 Minutes Intravenous Every 6 hours 02/11/21 0942 02/11/21 1911   02/11/21 0600  ceFAZolin (ANCEF) IVPB 2g/100 mL premix        2 g 200 mL/hr over 30 Minutes Intravenous On call to O.R. 02/11/21 [id number] 02/11/21 0737       Patient was given sequential compression devices, early ambulation, and chemoprophylaxis to prevent DVT.  Patient benefited maximally from hospital stay and there were no complications.    Recent vital signs:  Patient Vitals for the past 24 hrs:  BP Temp  Temp src Pulse Resp SpO2  02/12/21 0441 133/84 98.2 F (36.8 C) Oral 98 16 100 %  02/12/21 0055 125/83 98 F (36.7 C) Oral 85 16 98 %  02/11/21 2218 124/77 98.3 F (36.8 C) Oral 78 16 98 %  02/11/21 1506 114/76 98.1 F (36.7 C) - 74 16 98 %  02/11/21 1327 130/87 98 F (36.7 C) Oral (!) 105 16 100 %  02/11/21 1300 (!) 119/97 - - (!) 101 15 100 %  02/11/21 1200 139/86 - - 96 17 100 %  02/11/21 1130 (!) 144/107 - - 99 17 100 %  02/11/21 1115 123/80 - - 90 13 100 %  02/11/21 1100 112/73 - - 85 11 100 %  02/11/21 1045 117/72 - - 81 (!) 9 100 %  02/11/21 1030 110/74 - - 85 12 100 %  02/11/21 1015 104/68 - - 81 13 100 %  02/11/21 1000 115/78 - - 83 14 100 %  02/11/21 0945 103/73 - - 92 13 100 %  02/11/21 0938 100/66 (!) 97.4 F (36.3 C) - 80 10 100 %     Recent laboratory studies: No results for input(s): WBC, HGB, HCT, PLT, NA, K, CL, CO2, BUN, CREATININE, GLUCOSE, INR, CALCIUM in the last 72 hours.  Invalid input(s): PT, 2   Discharge Medications:   Allergies as of 02/12/2021      Reactions   Other Anaphylaxis   ALLERGIC TO ORAL CONTRACEPTIVES---SEIZURE.   Oxycodone  SEIZURE, but pt says she can take vicoden      Medication List    TAKE these medications   acetaZOLAMIDE 250 MG tablet Commonly known as: DIAMOX Take 125 mg by mouth daily.   acyclovir ointment 5 % Commonly known as: ZOVIRAX Apply 1 application topically 5 (five) times daily as needed.   ALPRAZolam 0.5 MG tablet Commonly known as: XANAX Take 0.5 mg by mouth daily as needed for anxiety.   aspirin 81 MG chewable tablet Chew 1 tablet (81 mg total) by mouth 2 (two) times daily.   cholecalciferol 25 MCG (1000 UNIT) tablet Commonly known as: VITAMIN D3 Take 1,000 Units by mouth daily.   HYDROcodone-acetaminophen 5-325 MG tablet Commonly known as: NORCO/VICODIN Take 1-2 tablets by mouth every 6 (six) hours as needed for moderate pain or severe pain (for post op pain). What changed: reasons to take  this   LamoTRIgine 300 MG Tb24 24 hour tablet Take 300 mg by mouth 2 (two) times daily.   levETIRAcetam 500 MG 24 hr tablet Commonly known as: KEPPRA XR Take 1,000 mg by mouth 2 (two) times daily.   levothyroxine 125 MCG tablet Commonly known as: SYNTHROID Take 125 mcg by mouth daily.   LORazepam 0.5 MG tablet Commonly known as: ATIVAN Take 0.5 mg by mouth daily as needed for seizure.   multivitamin with minerals Tabs tablet Take 1 tablet by mouth daily.   oxcarbazepine 600 MG tablet Commonly known as: TRILEPTAL Take 300 mg by mouth 2 (two) times daily.   tiZANidine 4 MG tablet Commonly known as: Zanaflex Take 1 tablet (4 mg total) by mouth every 6 (six) hours as needed for muscle spasms.   vitamin B-12 100 MCG tablet Commonly known as: CYANOCOBALAMIN Take 100 mcg by mouth daily.            Durable Medical Equipment  (From admission, onward)         Start     Ordered   02/11/21 1330  DME Walker rolling  Once       Question:  Patient needs a walker to treat with the following condition  Answer:  Primary osteoarthritis of right knee   02/11/21 1330   02/11/21 1330  DME 3 n 1  Once        02/11/21 1330   02/11/21 1330  DME Bedside commode  Once       Question:  Patient needs a bedside commode to treat with the following condition  Answer:  Primary osteoarthritis of right knee   02/11/21 1330          Diagnostic Studies: DG Chest 2 View  Result Date: 02/05/2021 CLINICAL DATA:  Preop evaluation for upcoming knee replacement EXAM: CHEST - 2 VIEW COMPARISON:  07/06/2019 FINDINGS: The heart size and mediastinal contours are within normal limits. Both lungs are clear. The visualized skeletal structures are unremarkable. IMPRESSION: No active cardiopulmonary disease. Electronically Signed   By: Inez Catalina M.D.   On: 02/05/2021 01:23    Disposition: Discharge disposition: 01-Home or Self Care       Discharge Instructions    Call MD / Call 911   Complete by:  As directed    If you experience chest pain or shortness of breath, CALL 911 and be transported to the hospital emergency room.  If you develope a fever above 101 F, pus (white drainage) or increased drainage or redness at the wound, or calf pain, call your surgeon's office.   Constipation Prevention  Complete by: As directed    Drink plenty of fluids.  Prune juice may be helpful.  You may use a stool softener, such as Colace (over the counter) 100 mg twice a day.  Use MiraLax (over the counter) for constipation as needed.   Diet - low sodium heart healthy   Complete by: As directed    Discharge instructions   Complete by: As directed    INSTRUCTIONS AFTER JOINT REPLACEMENT   Remove items at home which could result in a fall. This includes throw rugs or furniture in walking pathways ICE to the affected joint every three hours while awake for 30 minutes at a time, for at least the first 3-5 days, and then as needed for pain and swelling.  Continue to use ice for pain and swelling. You may notice swelling that will progress down to the foot and ankle.  This is normal after surgery.  Elevate your leg when you are not up walking on it.   Continue to use the breathing machine you got in the hospital (incentive spirometer) which will help keep your temperature down.  It is common for your temperature to cycle up and down following surgery, especially at night when you are not up moving around and exerting yourself.  The breathing machine keeps your lungs expanded and your temperature down.   DIET:  As you were doing prior to hospitalization, we recommend a well-balanced diet.  DRESSING / WOUND CARE / SHOWERING  You may shower 3 days after surgery, but keep the wounds dry during showering.  You may use an occlusive plastic wrap (Press'n Seal for example), NO SOAKING/SUBMERGING IN THE BATHTUB.  If the bandage gets wet, change with a clean dry gauze.  If the incision gets wet, pat the wound dry with a  clean towel.  ACTIVITY  Increase activity slowly as tolerated, but follow the weight bearing instructions below.   No driving for 6 weeks or until further direction given by your physician.  You cannot drive while taking narcotics.  No lifting or carrying greater than 10 lbs. until further directed by your surgeon. Avoid periods of inactivity such as sitting longer than an hour when not asleep. This helps prevent blood clots.  You may return to work once you are authorized by your doctor.     WEIGHT BEARING   Weight bearing as tolerated with assist device (walker, cane, etc) as directed, use it as long as suggested by your surgeon or therapist, typically at least 4-6 weeks.   EXERCISES  Results after joint replacement surgery are often greatly improved when you follow the exercise, range of motion and muscle strengthening exercises prescribed by your doctor. Safety measures are also important to protect the joint from further injury. Any time any of these exercises cause you to have increased pain or swelling, decrease what you are doing until you are comfortable again and then slowly increase them. If you have problems or questions, call your caregiver or physical therapist for advice.   Rehabilitation is important following a joint replacement. After just a few days of immobilization, the muscles of the leg can become weakened and shrink (atrophy).  These exercises are designed to build up the tone and strength of the thigh and leg muscles and to improve motion. Often times heat used for twenty to thirty minutes before working out will loosen up your tissues and help with improving the range of motion but do not use heat for the first two weeks  following surgery (sometimes heat can increase post-operative swelling).   These exercises can be done on a training (exercise) mat, on the floor, on a table or on a bed. Use whatever works the best and is most comfortable for you.    Use music or  television while you are exercising so that the exercises are a pleasant break in your day. This will make your life better with the exercises acting as a break in your routine that you can look forward to.   Perform all exercises about fifteen times, three times per day or as directed.  You should exercise both the operative leg and the other leg as well.  Exercises include:   Quad Sets - Tighten up the muscle on the front of the thigh (Quad) and hold for 5-10 seconds.   Straight Leg Raises - With your knee straight (if you were given a brace, keep it on), lift the leg to 60 degrees, hold for 3 seconds, and slowly lower the leg.  Perform this exercise against resistance later as your leg gets stronger.  Leg Slides: Lying on your back, slowly slide your foot toward your buttocks, bending your knee up off the floor (only go as far as is comfortable). Then slowly slide your foot back down until your leg is flat on the floor again.  Angel Wings: Lying on your back spread your legs to the side as far apart as you can without causing discomfort.  Hamstring Strength:  Lying on your back, push your heel against the floor with your leg straight by tightening up the muscles of your buttocks.  Repeat, but this time bend your knee to a comfortable angle, and push your heel against the floor.  You may put a pillow under the heel to make it more comfortable if necessary.   A rehabilitation program following joint replacement surgery can speed recovery and prevent re-injury in the future due to weakened muscles. Contact your doctor or a physical therapist for more information on knee rehabilitation.    CONSTIPATION  Constipation is defined medically as fewer than three stools per week and severe constipation as less than one stool per week.  Even if you have a regular bowel pattern at home, your normal regimen is likely to be disrupted due to multiple reasons following surgery.  Combination of anesthesia,  postoperative narcotics, change in appetite and fluid intake all can affect your bowels.   YOU MUST use at least one of the following options; they are listed in order of increasing strength to get the job done.  They are all available over the counter, and you may need to use some, POSSIBLY even all of these options:    Drink plenty of fluids (prune juice may be helpful) and high fiber foods Colace 100 mg by mouth twice a day  Senokot for constipation as directed and as needed Dulcolax (bisacodyl), take with full glass of water  Miralax (polyethylene glycol) once or twice a day as needed.  If you have tried all these things and are unable to have a bowel movement in the first 3-4 days after surgery call either your surgeon or your primary doctor.    If you experience loose stools or diarrhea, hold the medications until you stool forms back up.  If your symptoms do not get better within 1 week or if they get worse, check with your doctor.  If you experience "the worst abdominal pain ever" or develop nausea or vomiting, please contact  the office immediately for further recommendations for treatment.   ITCHING:  If you experience itching with your medications, try taking only a single pain pill, or even half a pain pill at a time.  You can also use Benadryl over the counter for itching or also to help with sleep.   TED HOSE STOCKINGS:  Use stockings on both legs until for at least 2 weeks or as directed by physician office. They may be removed at night for sleeping.  MEDICATIONS:  See your medication summary on the "After Visit Summary" that nursing will review with you.  You may have some home medications which will be placed on hold until you complete the course of blood thinner medication.  It is important for you to complete the blood thinner medication as prescribed.  PRECAUTIONS:  If you experience chest pain or shortness of breath - call 911 immediately for transfer to the hospital emergency  department.   If you develop a fever greater that 101 F, purulent drainage from wound, increased redness or drainage from wound, foul odor from the wound/dressing, or calf pain - CONTACT YOUR SURGEON.                                                   FOLLOW-UP APPOINTMENTS:  If you do not already have a post-op appointment, please call the office for an appointment to be seen by your surgeon.  Guidelines for how soon to be seen are listed in your "After Visit Summary", but are typically between 1-4 weeks after surgery.  OTHER INSTRUCTIONS:   Knee Replacement:  Do not place pillow under knee, focus on keeping the knee straight while resting. CPM instructions: 0-90 degrees, 2 hours in the morning, 2 hours in the afternoon, and 2 hours in the evening. Place foam block, curve side up under heel at all times except when in CPM or when walking.  DO NOT modify, tear, cut, or change the foam block in any way.  POST-OPERATIVE OPIOID TAPER INSTRUCTIONS: It is important to wean off of your opioid medication as soon as possible. If you do not need pain medication after your surgery it is ok to stop day one. Opioids include: Codeine, Hydrocodone(Norco, Vicodin), Oxycodone(Percocet, oxycontin) and hydromorphone amongst others.  Long term and even short term use of opiods can cause: Increased pain response Dependence Constipation Depression Respiratory depression And more.  Withdrawal symptoms can include Flu like symptoms Nausea, vomiting And more Techniques to manage these symptoms Hydrate well Eat regular healthy meals Stay active Use relaxation techniques(deep breathing, meditating, yoga) Do Not substitute Alcohol to help with tapering If you have been on opioids for less than two weeks and do not have pain than it is ok to stop all together.  Plan to wean off of opioids This plan should start within one week post op of your joint replacement. Maintain the same interval or time between taking  each dose and first decrease the dose.  Cut the total daily intake of opioids by one tablet each day Next start to increase the time between doses. The last dose that should be eliminated is the evening dose.     MAKE SURE YOU:  Understand these instructions.  Get help right away if you are not doing well or get worse.    Thank you for letting us be  a part of your medical care team.  It is a privilege we respect greatly.  We hope these instructions will help you stay on track for a fast and full recovery!   Increase activity slowly as tolerated   Complete by: As directed        Follow-up Information    Melrose Nakayama, MD. Schedule an appointment as soon as possible for a visit in 2 weeks.   Specialty: Orthopedic Surgery Contact information: [place] [address] [phone]                Signed: Larwance Sachs Nida 02/12/2021, 8:04 AM

## 2021-02-12 NOTE — TOC Transition Note (Signed)
Transition of Care [facility]) - CM/SW Discharge Note   Patient Details  Name: [name] MRN: [id number] Date of Birth: [birth year]  [facility] CM/SW Contact:  Lennart Pall, LCSW Phone Number: 02/12/2021, 10:37 AM   Clinical Narrative:    Met with pt to review dc arrangements. Pt confirms she has all needed DME.  Plan for HHPT via [facility]  No further TOC needs.   Final next level of care: [place] Barriers to Discharge: Continued Medical Work up   Patient Goals and CMS Choice Patient states their goals for this hospitalization and ongoing recovery are:: return home      Discharge Placement                       Discharge Plan and Services                DME Arranged: N/A DME Agency: NA       HH Arranged: PT HH Agency: [facility] [employer] Date [place]:  (arranged prior to surgery)      Social Determinants of Health (SDOH) Interventions     Readmission Risk Interventions No flowsheet data found.

## 2021-02-12 NOTE — Progress Notes (Signed)
Called into room by physical therapy. Patients HR was around 160 bpm when ambulating. Patient now sitting in the chair and HR is 120-130 bpm at rest. PA Loni Dolly was called, new orders placed. Patient is not experiencing symptoms. Denies pain or feelings of anxiety.

## 2021-02-12 NOTE — Progress Notes (Signed)
Physical Therapy Treatment Patient Details Name: [name] MRN: [id number] DOB: 06-22-1971 Today's Date: 02/12/2021    History of Present Illness 50 y.o. female admitted for R TKA on 02/11/21. 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    PT Comments    Pt tolerated increased ambulation distance of 30' with RW, no loss of balance. Distance limited by R knee pain. HR 133 max with ambulation, HR 109 at rest prior to activity. She would benefit from one more night in the hospital for stair training in the morning.    Follow Up Recommendations  Follow surgeon's recommendation for DC plan and follow-up therapies;Home health PT     Equipment Recommendations  None recommended by PT    Recommendations for Other Services       Precautions / Restrictions Precautions Precautions: Knee;Fall Precaution Booklet Issued: Yes (comment) Precaution Comments: reviewed no pillow under knee; pt reports 1 fall in past year due to slipping on ice Restrictions Weight Bearing Restrictions: No RLE Weight Bearing: Weight bearing as tolerated Other Position/Activity Restrictions: WBAT    Mobility  Bed Mobility Overal bed mobility: Modified Independent Bed Mobility: Supine to Sit     Supine to sit: Modified independent (Device/Increase time)     General bed mobility comments: used bedrail, HOB up, increased time    Transfers Overall transfer level: Needs assistance Equipment used: Rolling walker (2 wheeled) Transfers: Sit to/from Stand Sit to Stand: Min guard;From elevated surface         General transfer comment: VCs hand placement  Ambulation/Gait Ambulation/Gait assistance: Min guard Gait Distance (Feet): 30 Feet Assistive device: Rolling walker (2 wheeled) Gait Pattern/deviations: Step-to pattern;Decreased step length - right;Decreased step length - left Gait velocity: decr   General Gait Details: VCs sequencing, distance limited by R knee pain, HR 133  max while walking   Stairs             Wheelchair Mobility    Modified Rankin (Stroke Patients Only)       Balance Overall balance assessment: Needs assistance Sitting-balance support: Feet supported Sitting balance-Leahy Scale: Fair     Standing balance support: Bilateral upper extremity supported Standing balance-Leahy Scale: Poor                              Cognition Arousal/Alertness: Awake/alert Behavior During Therapy: WFL for tasks assessed/performed Overall Cognitive Status: History of cognitive impairments - at baseline (short term memory loss 2* h/o TBI)                                 General Comments: alert and oriented x 3; does not recall some events from yesterday, nor HEP instructions from this morning      Exercises Total Joint Exercises Ankle Circles/Pumps: AROM;10 reps;Both;Supine Quad Sets: AROM;Both;5 reps;Supine Short Arc Quad: AROM;Right;5 reps;Supine Heel Slides: AAROM;Right;10 reps;Supine Hip ABduction/ADduction: AROM;Right;5 reps;Supine Straight Leg Raises: AAROM;Right;5 reps;Supine;AROM Goniometric ROM: 5-55* AAROm R knee    General Comments        Pertinent Vitals/Pain Pain Score: 7  Pain Location: R knee with movement Pain Descriptors / Indicators: Sore;Aching;Guarding;Grimacing Pain Intervention(s): Limited activity within patient's tolerance;Monitored during session;Patient requesting pain meds-RN notified;RN gave pain meds during session;Ice applied    Home Living  Prior Function            PT Goals (current goals can now be found in the care plan section) Acute Rehab PT Goals Patient Stated Goal: be able to walk dog and exercise PT Goal Formulation: With patient Time For Goal Achievement: 02/18/21 Potential to Achieve Goals: Good Progress towards PT goals: Progressing toward goals    Frequency    7X/week      PT Plan Current plan remains appropriate     Co-evaluation              AM-PAC PT "6 Clicks" Mobility   Outcome Measure  Help needed turning from your back to your side while in a flat bed without using bedrails?: A Little Help needed moving from lying on your back to sitting on the side of a flat bed without using bedrails?: A Little Help needed moving to and from a bed to a chair (including a wheelchair)?: A Little Help needed standing up from a chair using your arms (e.g., wheelchair or bedside chair)?: A Little Help needed to walk in hospital room?: A Little Help needed climbing 3-5 steps with a railing? : A Lot 6 Click Score: 17    End of Session Equipment Utilized During Treatment: Gait belt Activity Tolerance: Patient limited by pain (tachycardia with activity) Patient left: in chair;with call bell/phone within reach;with chair alarm set Nurse Communication: Mobility status PT Visit Diagnosis: Difficulty in walking, not elsewhere classified (R26.2);Pain Pain - Right/Left: Right Pain - part of body: Knee     Time: 2778-2423 PT Time Calculation (min) (ACUTE ONLY): 22 min  Charges:  $Gait Training: 8-22 mins                    Blondell Reveal Kistler PT 02/12/2021  Acute Rehabilitation Services Pager [phone] Office [phone]

## 2021-02-12 NOTE — Progress Notes (Signed)
Subjective: 1 Day Post-Op Procedure(s) (LRB): RIGHT TOTAL KNEE ARTHROPLASTY (Right)   Patient doing well. Hoping to go home today.  Activity level:  wbat Diet tolerance:  ok Voiding:  ok Patient reports pain as mild.    Objective: Vital signs in last 24 hours: Temp:  [97.4 F (36.3 C)-98.3 F (36.8 C)] 98.2 F (36.8 C) (03/16 0441) Pulse Rate:  [74-105] 98 (03/16 0441) Resp:  [9-17] 16 (03/16 0441) BP: (100-144)/(66-107) 133/84 (03/16 0441) SpO2:  [98 %-100 %] 100 % (03/16 0441)  Labs: No results for input(s): HGB in the last 72 hours. No results for input(s): WBC, RBC, HCT, PLT in the last 72 hours. No results for input(s): NA, K, CL, CO2, BUN, CREATININE, GLUCOSE, CALCIUM in the last 72 hours. No results for input(s): LABPT, INR in the last 72 hours.  Physical Exam:  Neurologically intact ABD soft Neurovascular intact Sensation intact distally Intact pulses distally Dorsiflexion/Plantar flexion intact Incision: dressing C/D/I No cellulitis present Compartment soft  Assessment/Plan:  1 Day Post-Op Procedure(s) (LRB): RIGHT TOTAL KNEE ARTHROPLASTY (Right) Advance diet Up with therapy D/C IV fluids Discharge home with home health today if doing well and cleared by PT. Continue on 81mg  asa bid for dvt prevention. Follow up in office 2 weeks post op.   [name] [name] 02/12/2021, 7:57 AM

## 2021-02-13 DIAGNOSIS — E039 Hypothyroidism, unspecified: Secondary | ICD-10-CM | POA: Diagnosis not present

## 2021-02-13 DIAGNOSIS — M1711 Unilateral primary osteoarthritis, right knee: Secondary | ICD-10-CM | POA: Diagnosis not present

## 2021-02-13 DIAGNOSIS — Z8669 Personal history of other diseases of the nervous system and sense organs: Secondary | ICD-10-CM | POA: Diagnosis not present

## 2021-02-13 DIAGNOSIS — Z79899 Other long term (current) drug therapy: Secondary | ICD-10-CM | POA: Diagnosis not present

## 2021-02-13 DIAGNOSIS — Z791 Long term (current) use of non-steroidal anti-inflammatories (NSAID): Secondary | ICD-10-CM | POA: Diagnosis not present

## 2021-02-13 LAB — COMPREHENSIVE METABOLIC PANEL
ALT: 39 U/L (ref 0–44)
AST: 83 U/L — ABNORMAL HIGH (ref 15–41)
Albumin: 3.8 g/dL (ref 3.5–5.0)
Alkaline Phosphatase: 80 U/L (ref 38–126)
Anion gap: 8 (ref 5–15)
BUN: 8 mg/dL (ref 6–20)
CO2: 25 mmol/L (ref 22–32)
Calcium: 8.6 mg/dL — ABNORMAL LOW (ref 8.9–10.3)
Chloride: 106 mmol/L (ref 98–111)
Creatinine, Ser: 0.6 mg/dL (ref 0.44–1.00)
GFR, Estimated: >60 mL/min (ref >60)
Glucose, Bld: 115 mg/dL — ABNORMAL HIGH (ref 70–99)
Potassium: 3.6 mmol/L (ref 3.5–5.1)
Sodium: 139 mmol/L (ref 135–145)
Total Bilirubin: 0.6 mg/dL (ref 0.3–1.2)
Total Protein: 6.2 g/dL — ABNORMAL LOW (ref 6.5–8.1)

## 2021-02-13 LAB — CBC
HCT: 33.7 % — ABNORMAL LOW (ref 36.0–46.0)
Hemoglobin: 10.6 g/dL — ABNORMAL LOW (ref 12.0–15.0)
MCH: 30.4 pg (ref 26.0–34.0)
MCHC: 31.5 g/dL (ref 30.0–36.0)
MCV: 96.6 fL (ref 80.0–100.0)
Platelets: 219 10*3/uL (ref 150–400)
RBC: 3.49 MIL/uL — ABNORMAL LOW (ref 3.87–5.11)
RDW: 13.6 % (ref 11.5–15.5)
WBC: 8.6 10*3/uL (ref 4.0–10.5)
nRBC: 0 % (ref 0.0–0.2)

## 2021-02-13 MED ORDER — SODIUM CHLORIDE 0.9 % IV BOLUS
500.0000 mL | Freq: Once | INTRAVENOUS | Status: AC
  Administered 2021-02-13: 500 mL via INTRAVENOUS

## 2021-02-13 NOTE — Progress Notes (Signed)
Notified PA Loni Dolly that patient was only able to take a few steps with PT during the afternoon session and she began to feel dizzy. New orders placed for a 500 cc bolus.

## 2021-02-13 NOTE — Progress Notes (Signed)
Patient up working with PT. Patients HR resting is in the upper 90's to 100. When ambulating HR today has jumped up into the 140's. PA Mitzi Hansen was notified and is aware. No new orders have been placed.

## 2021-02-13 NOTE — Progress Notes (Addendum)
Physical Therapy Treatment Patient Details Name: [name] MRN: [id number] DOB: 01/18/1971 Today's Date: 02/13/2021    History of Present Illness 50 y.o. female admitted for R TKA on 02/11/21. 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    PT Comments    Pt reported feeling, "like I'm on a boat" in sitting but felt she could walk, then reported dizziness after ambulating only  3' with RW.  Assisted pt to recliner where BP was 128/78, HR was 115 with walking,  HR 100-107 after 1 minute seated rest. Assisted pt with TKA HEP. Pt is having more difficulty with mobility this afternoon. She required increased assistance for sit to stand, nurse tech also reported she required 2 person assist for sit to stand earlier today. Noted  RLE is quite edematous, calf is nontender, no pain with Homan's test.  This is an overall decline in activity tolerance this afternoon, RN aware and is communicating updates with ortho PA. From a PT standpoint, she is not ready to DC home today.    Follow Up Recommendations  Follow surgeon's recommendation for DC plan and follow-up therapies;Home health PT     Equipment Recommendations  None recommended by PT    Recommendations for Other Services       Precautions / Restrictions Precautions Precautions: Knee;Fall Precaution Booklet Issued: Yes (comment) Precaution Comments: reviewed no pillow under knee; pt reports 1 fall in past year due to slipping on ice Restrictions Weight Bearing Restrictions: No RLE Weight Bearing: Weight bearing as tolerated Other Position/Activity Restrictions: WBAT    Mobility  Bed Mobility Overal bed mobility: Modified Independent Bed Mobility: Supine to Sit     Supine to sit: Modified independent (Device/Increase time)     General bed mobility comments: used bedrail, HOB up, increased time, gait belt used as RLE lifter    Transfers Overall transfer level: Needs assistance Equipment used: Rolling  walker (2 wheeled) Transfers: Sit to/from Stand Sit to Stand: From elevated surface;Mod assist         General transfer comment: VCs hand placement, increased effort and assist needed for sit to stand  Ambulation/Gait Ambulation/Gait assistance: Min assist Gait Distance (Feet): 3 Feet Assistive device: Rolling walker (2 wheeled) Gait Pattern/deviations: Step-to pattern;Decreased step length - right;Decreased step length - left Gait velocity: decr   General Gait Details: ongoing VCs for sequencing, distance limited by onset of dizziness and R knee pain, assisted pt to recliner where BP was 128/78, HR 115 walking, HR 100-107 at rest   Stairs             Wheelchair Mobility    Modified Rankin (Stroke Patients Only)       Balance Overall balance assessment: Needs assistance Sitting-balance support: Feet supported Sitting balance-Leahy Scale: Fair     Standing balance support: Bilateral upper extremity supported Standing balance-Leahy Scale: Poor                              Cognition Arousal/Alertness: Awake/alert Behavior During Therapy: WFL for tasks assessed/performed Overall Cognitive Status: History of cognitive impairments - at baseline (short term memory loss 2* h/o TBI)                                 General Comments: alert and oriented x 3; h/o TBI with short term memory loss,  does not recall  some PT education from yesterday, repeats questions and statements      Exercises Total Joint Exercises Ankle Circles/Pumps: AROM;10 reps;Both;Supine Quad Sets: AROM;Both;5 reps;Supine Short Arc Quad: AROM;Right;Supine;10 reps Heel Slides: AAROM;Right;10 reps;Supine Hip ABduction/ADduction: Right;Supine;10 reps;AAROM Straight Leg Raises: AAROM;Right;Supine;10 reps Knee Flexion: AROM;Right;10 reps;Seated Goniometric ROM: 5-55* AAROM R knee, limited by pain    General Comments        Pertinent Vitals/Pain Pain Score: 7  Pain  Location: R knee with movement Pain Descriptors / Indicators: Sore;Aching;Guarding;Grimacing Pain Intervention(s): Limited activity within patient's tolerance;Monitored during session;Premedicated before session;Ice applied;Repositioned    Home Living                      Prior Function            PT Goals (current goals can now be found in the care plan section) Acute Rehab PT Goals Patient Stated Goal: be able to walk dog and exercise PT Goal Formulation: With patient Time For Goal Achievement: 02/18/21 Potential to Achieve Goals: Good Progress towards PT goals: Not progressing toward goals - comment (dizziness and pain limiting progress)    Frequency    7X/week      PT Plan Current plan remains appropriate    Co-evaluation              AM-PAC PT "6 Clicks" Mobility   Outcome Measure  Help needed turning from your back to your side while in a flat bed without using bedrails?: A Little Help needed moving from lying on your back to sitting on the side of a flat bed without using bedrails?: A Little Help needed moving to and from a bed to a chair (including a wheelchair)?: A Little Help needed standing up from a chair using your arms (e.g., wheelchair or bedside chair)?: A Lot Help needed to walk in hospital room?: A Little Help needed climbing 3-5 steps with a railing? : A Lot 6 Click Score: 16    End of Session Equipment Utilized During Treatment: Gait belt Activity Tolerance: Patient limited by pain;Treatment limited secondary to medical complications (Comment) (dizziness) Patient left: in chair;with call bell/phone within reach;with chair alarm set Nurse Communication: Mobility status PT Visit Diagnosis: Difficulty in walking, not elsewhere classified (R26.2);Pain Pain - Right/Left: Right Pain - part of body: Knee     Time: 7494-4967 PT Time Calculation (min) (ACUTE ONLY): 32 min  Charges:  $Gait Training: 8-22 mins $Therapeutic Exercise: 8-22  mins                    Blondell Reveal Kistler PT 02/13/2021  Acute Rehabilitation Services Pager [phone] Office [phone]

## 2021-02-13 NOTE — Progress Notes (Signed)
Subjective: 2 Days Post-Op Procedure(s) (LRB): RIGHT TOTAL KNEE ARTHROPLASTY (Right)  Patient feels better this mornign. She is hoping to go home today after PT.  Activity level:  wbat Diet tolerance:  ok Voiding:  ok Patient reports pain as mild.    Objective: Vital signs in last 24 hours: Temp:  [97.6 F (36.4 C)-98.2 F (36.8 C)] 98.2 F (36.8 C) (03/16 2117) Pulse Rate:  [88-161] 90 (03/16 2117) Resp:  [18] 18 (03/16 2117) BP: (120-138)/(78-88) 138/88 (03/16 2117) SpO2:  [97 %-100 %] 97 % (03/16 2117)  Labs: No results for input(s): HGB in the last 72 hours. No results for input(s): WBC, RBC, HCT, PLT in the last 72 hours. No results for input(s): NA, K, CL, CO2, BUN, CREATININE, GLUCOSE, CALCIUM in the last 72 hours. No results for input(s): LABPT, INR in the last 72 hours.  Physical Exam:  Neurologically intact ABD soft Neurovascular intact Sensation intact distally Intact pulses distally Dorsiflexion/Plantar flexion intact Incision: dressing C/D/I and no drainage No cellulitis present Compartment soft  Assessment/Plan:  2 Days Post-Op Procedure(s) (LRB): RIGHT TOTAL KNEE ARTHROPLASTY (Right) Advance diet Up with therapy Discharge home with home health today after PT. Continue on asa 81mg  BID for dvt prevention. folowo up in office 2 weeks post op.  Larwance Sachs Jewel Venditto 02/13/2021, 6:25 AM

## 2021-02-13 NOTE — Progress Notes (Signed)
Physical Therapy Treatment Patient Details Name: [name] MRN: [id number] DOB: 10-26-1971 Today's Date: 02/13/2021    History of Present Illness 50 y.o. female admitted for R TKA on 02/11/21. 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    PT Comments    Pt is progressing slowly with mobility. She ambulated 76' with RW, distance limited by R knee pain and fatigue, HR 147 max while ambulating. HR 100 at rest. Verbal cues for relaxation breathing given. Pt puts forth good effort, however pain is limiting progress, despite use of pain medication prior to PT session. Will plan to attempt stair training this afternoon.    Follow Up Recommendations  Follow surgeon's recommendation for DC plan and follow-up therapies;Home health PT     Equipment Recommendations  None recommended by PT    Recommendations for Other Services       Precautions / Restrictions Precautions Precautions: Knee;Fall Precaution Booklet Issued: Yes (comment) Precaution Comments: reviewed no pillow under knee; pt reports 1 fall in past year due to slipping on ice Restrictions Weight Bearing Restrictions: Yes RLE Weight Bearing: Weight bearing as tolerated Other Position/Activity Restrictions: WBAT    Mobility  Bed Mobility Overal bed mobility: Modified Independent Bed Mobility: Supine to Sit     Supine to sit: Modified independent (Device/Increase time)     General bed mobility comments: used bedrail, HOB up, increased time, gait belt used as RLE lifter    Transfers Overall transfer level: Needs assistance Equipment used: Rolling walker (2 wheeled) Transfers: Sit to/from Stand Sit to Stand: From elevated surface;Min assist         General transfer comment: VCs hand placement, min A to power up  Ambulation/Gait Ambulation/Gait assistance: Min assist Gait Distance (Feet): 34 Feet Assistive device: Rolling walker (2 wheeled) Gait Pattern/deviations: Step-to  pattern;Decreased step length - right;Decreased step length - left Gait velocity: decr   General Gait Details: ongoing VCs for sequencing, distance limited by R knee pain, HR 147 max while walking   Stairs             Wheelchair Mobility    Modified Rankin (Stroke Patients Only)       Balance Overall balance assessment: Needs assistance Sitting-balance support: Feet supported Sitting balance-Leahy Scale: Fair     Standing balance support: Bilateral upper extremity supported Standing balance-Leahy Scale: Poor                              Cognition Arousal/Alertness: Awake/alert Behavior During Therapy: WFL for tasks assessed/performed Overall Cognitive Status: History of cognitive impairments - at baseline (short term memory loss 2* h/o TBI)                                 General Comments: alert and oriented x 3; h/o TBI with short term memory loss,  does not recall some PT education from yesterday, repeats questions and statements      Exercises Total Joint Exercises Ankle Circles/Pumps: AROM;Both;Supine;20 reps Quad Sets: AROM;Both;5 reps;Supine Knee Flexion: AROM;Right;10 reps;Seated Goniometric ROM: 5-55* AAROM R knee, limited by pain    General Comments        Pertinent Vitals/Pain Pain Score: 7  Pain Location: R knee with movement Pain Descriptors / Indicators: Sore;Aching;Guarding;Grimacing Pain Intervention(s): Limited activity within patient's tolerance;Monitored during session;Premedicated before session;Ice applied;Repositioned    Home Living  Prior Function            PT Goals (current goals can now be found in the care plan section) Acute Rehab PT Goals Patient Stated Goal: be able to walk dog and exercise PT Goal Formulation: With patient Time For Goal Achievement: 02/18/21 Potential to Achieve Goals: Good Progress towards PT goals: Progressing toward goals    Frequency     7X/week      PT Plan Current plan remains appropriate    Co-evaluation              AM-PAC PT "6 Clicks" Mobility   Outcome Measure  Help needed turning from your back to your side while in a flat bed without using bedrails?: A Little Help needed moving from lying on your back to sitting on the side of a flat bed without using bedrails?: A Little Help needed moving to and from a bed to a chair (including a wheelchair)?: A Little Help needed standing up from a chair using your arms (e.g., wheelchair or bedside chair)?: A Little Help needed to walk in hospital room?: A Little Help needed climbing 3-5 steps with a railing? : A Lot 6 Click Score: 17    End of Session Equipment Utilized During Treatment: Gait belt Activity Tolerance: Patient limited by pain;Treatment limited secondary to medical complications (Comment) (tachycardia with activity) Patient left: in chair;with call bell/phone within reach;with chair alarm set Nurse Communication: Mobility status PT Visit Diagnosis: Difficulty in walking, not elsewhere classified (R26.2);Pain Pain - Right/Left: Right Pain - part of body: Knee     Time: 4970-2637 PT Time Calculation (min) (ACUTE ONLY): 39 min  Charges:  $Gait Training: 8-22 mins $Therapeutic Exercise: 8-22 mins $Therapeutic Activity: 8-22 mins                    Blondell Reveal Kistler PT 02/13/2021  Acute Rehabilitation Services Pager [phone] Office [phone]

## 2021-02-14 DIAGNOSIS — M1711 Unilateral primary osteoarthritis, right knee: Secondary | ICD-10-CM | POA: Diagnosis not present

## 2021-02-14 DIAGNOSIS — Z791 Long term (current) use of non-steroidal anti-inflammatories (NSAID): Secondary | ICD-10-CM | POA: Diagnosis not present

## 2021-02-14 DIAGNOSIS — Z8669 Personal history of other diseases of the nervous system and sense organs: Secondary | ICD-10-CM | POA: Diagnosis not present

## 2021-02-14 DIAGNOSIS — Z79899 Other long term (current) drug therapy: Secondary | ICD-10-CM | POA: Diagnosis not present

## 2021-02-14 DIAGNOSIS — E039 Hypothyroidism, unspecified: Secondary | ICD-10-CM | POA: Diagnosis not present

## 2021-02-14 MED ORDER — KETOROLAC TROMETHAMINE 15 MG/ML IJ SOLN
15.0000 mg | Freq: Four times a day (QID) | INTRAMUSCULAR | Status: DC
  Administered 2021-02-14: 15 mg via INTRAVENOUS
  Filled 2021-02-14: qty 1

## 2021-02-14 NOTE — Progress Notes (Signed)
Physical Therapy Treatment Patient Details Name: [name] MRN: [id number] DOB: [birth year] Today's Date: 02/14/2021    History of Present Illness 50 y.o. female admitted for R TKA on 02/11/21. 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    PT Comments    Pt tolerated increased activity level this session. She ambulated 75' with RW and ascended/descended 2 stairs with RW and min assist. Ambulation distance limited by pain, fatigue, dizziness. After being assisted to a recliner, pt's vitals were: BP 121/72, HR 115, SaO2 96% on room air.   Follow Up Recommendations  Follow surgeon's recommendation for DC plan and follow-up therapies;Home health PT     Equipment Recommendations  None recommended by PT    Recommendations for Other Services       Precautions / Restrictions Precautions Precautions: Knee;Fall Precaution Booklet Issued: Yes (comment) Precaution Comments: reviewed no pillow under knee; pt reports 1 fall in past year due to slipping on ice Restrictions Weight Bearing Restrictions: No RLE Weight Bearing: Weight bearing as tolerated Other Position/Activity Restrictions: WBAT    Mobility  Bed Mobility Overal bed mobility: Modified Independent Bed Mobility: Supine to Sit     Supine to sit: Modified independent (Device/Increase time)     General bed mobility comments: used bedrail, HOB up, increased time, gait belt used as RLE lifter    Transfers Overall transfer level: Needs assistance Equipment used: Rolling walker (2 wheeled) Transfers: Sit to/from Stand Sit to Stand: From elevated surface;Min guard         General transfer comment: VCs hand placement  Ambulation/Gait Ambulation/Gait assistance: Min guard Gait Distance (Feet): 40 Feet Assistive device: Rolling walker (2 wheeled) Gait Pattern/deviations: Step-to pattern;Decreased step length - right;Decreased step length - left Gait velocity: decr   General Gait Details:  ongoing VCs for sequencing, distance limited by pain and dizziness, assisted pt to recliner where BP was 121/72, HR 115, SaO2 96% on room air   Stairs Stairs: Yes Stairs assistance: Min assist Stair Management: No rails;Backwards;With walker Number of Stairs: 2 General stair comments: VCs sequencing, no loss of balance, pt anxious, encouraged relaxation breathing, min A to steady RW; spouse to come at 1:00 for stair training   Wheelchair Mobility    Modified Rankin (Stroke Patients Only)       Balance Overall balance assessment: Needs assistance Sitting-balance support: Feet supported Sitting balance-Leahy Scale: Good     Standing balance support: Bilateral upper extremity supported Standing balance-Leahy Scale: Poor                              Cognition Arousal/Alertness: Awake/alert Behavior During Therapy: WFL for tasks assessed/performed Overall Cognitive Status: History of cognitive impairments - at baseline (short term memory loss 2* h/o TBI)                                 General Comments: alert and oriented x 3; h/o TBI with short term memory loss,  does not recall some PT education from yesterday, repeats questions and statements      Exercises Total Joint Exercises Ankle Circles/Pumps: AROM;10 reps;Both;Supine Knee Flexion: AROM;Right;Seated;15 reps    General Comments        Pertinent Vitals/Pain Pain Score: 6  Pain Location: R knee with movement Pain Descriptors / Indicators: Sore;Aching;Guarding;Grimacing Pain Intervention(s): Limited activity within patient's tolerance;Monitored during session;Premedicated before session;Ice  applied    Home Living                      Prior Function            PT Goals (current goals can now be found in the care plan section) Acute Rehab PT Goals Patient Stated Goal: be able to walk dog and exercise PT Goal Formulation: With patient Time For Goal Achievement:  02/18/21 Potential to Achieve Goals: Good Progress towards PT goals: Progressing toward goals    Frequency    7X/week      PT Plan Current plan remains appropriate    Co-evaluation              AM-PAC PT "6 Clicks" Mobility   Outcome Measure  Help needed turning from your back to your side while in a flat bed without using bedrails?: A Little Help needed moving from lying on your back to sitting on the side of a flat bed without using bedrails?: A Little Help needed moving to and from a bed to a chair (including a wheelchair)?: A Little Help needed standing up from a chair using your arms (e.g., wheelchair or bedside chair)?: A Little Help needed to walk in hospital room?: A Little Help needed climbing 3-5 steps with a railing? : A Little 6 Click Score: 18    End of Session Equipment Utilized During Treatment: Gait belt Activity Tolerance: Patient limited by pain;Treatment limited secondary to medical complications (Comment) (dizziness) Patient left: in chair;with call bell/phone within reach;with chair alarm set Nurse Communication: Mobility status PT Visit Diagnosis: Difficulty in walking, not elsewhere classified (R26.2);Pain Pain - Right/Left: Right Pain - part of body: Knee     Time: 0086-7619 PT Time Calculation (min) (ACUTE ONLY): 37 min  Charges:  $Gait Training: 8-22 mins $Therapeutic Exercise: 8-22 mins                     Blondell Reveal Kistler PT 02/14/2021  Acute Rehabilitation Services Pager [phone] Office [phone]

## 2021-02-14 NOTE — Progress Notes (Signed)
Physical Therapy Treatment Patient Details Name: [name] MRN: [id number] DOB: [birth year] Today's Date: 02/14/2021    History of Present Illness 50 y.o. female admitted for R TKA on 02/11/21. 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    PT Comments    Stair training completed with pt's husband. Reviewed HEP and precautions with pt/spouse. She is ready to DC home from PT standpoint.  Follow Up Recommendations  Follow surgeon's recommendation for DC plan and follow-up therapies;Home health PT     Equipment Recommendations  None recommended by PT    Recommendations for Other Services       Precautions / Restrictions Precautions Precautions: Knee;Fall Precaution Booklet Issued: Yes (comment) Precaution Comments: reviewed no pillow under knee with pt and spouse; pt reports 1 fall in past year due to slipping on ice Restrictions Weight Bearing Restrictions: No Other Position/Activity Restrictions: WBAT    Mobility  Bed Mobility               General bed mobility comments: up in recliner    Transfers Overall transfer level: Needs assistance Equipment used: Rolling walker (2 wheeled) Transfers: Sit to/from Stand Sit to Stand: From elevated surface;Min guard         General transfer comment: VCs hand placement  Ambulation/Gait Ambulation/Gait assistance: Min guard Gait Distance (Feet): 30 Feet Assistive device: Rolling walker (2 wheeled) Gait Pattern/deviations: Step-to pattern;Decreased step length - right;Decreased step length - left Gait velocity: decr   General Gait Details: ongoing VCs for sequencing, distance limited by pain and fatigue   Stairs   Stairs assistance: Min assist Stair Management: No rails;Backwards;With walker Number of Stairs: 2 General stair comments: VCs sequencing, no loss of balance,  min A to steady RW; spouse present and assisted with management of RW   Wheelchair Mobility    Modified Rankin  (Stroke Patients Only)       Balance Overall balance assessment: Needs assistance Sitting-balance support: Feet supported Sitting balance-Leahy Scale: Good     Standing balance support: Bilateral upper extremity supported Standing balance-Leahy Scale: Poor                              Cognition Arousal/Alertness: Awake/alert Behavior During Therapy: WFL for tasks assessed/performed Overall Cognitive Status: History of cognitive impairments - at baseline (short term memory loss 2* h/o TBI)                                 General Comments: alert and oriented x 3; h/o TBI with short term memory loss,  does not recall some PT education from yesterday, repeats questions and statements      Exercises Total Joint Exercises Ankle Circles/Pumps: AROM;10 reps;Both;Supine Quad Sets: AROM;Both;5 reps;Supine Short Arc Quad: AROM;Right;Supine;5 reps Heel Slides: AAROM;Right;Supine;5 reps Hip ABduction/ADduction: Right;Supine;AAROM;5 reps Straight Leg Raises: AAROM;Right;Supine;5 reps Knee Flexion: AROM;Right;Seated;10 reps Goniometric ROM: 5-50* AAROM R knee    General Comments        Pertinent Vitals/Pain Pain Score: 6  Pain Location: R knee with movement Pain Descriptors / Indicators: Sore;Aching;Guarding;Grimacing Pain Intervention(s): Limited activity within patient's tolerance;Monitored during session;Premedicated before session;Ice applied    Home Living                      Prior Function  PT Goals (current goals can now be found in the care plan section) Acute Rehab PT Goals Patient Stated Goal: be able to walk dog and exercise PT Goal Formulation: With patient Time For Goal Achievement: 02/18/21 Potential to Achieve Goals: Good Progress towards PT goals: Progressing toward goals    Frequency    7X/week      PT Plan Current plan remains appropriate    Co-evaluation              AM-PAC PT "6 Clicks"  Mobility   Outcome Measure  Help needed turning from your back to your side while in a flat bed without using bedrails?: A Little Help needed moving from lying on your back to sitting on the side of a flat bed without using bedrails?: A Little Help needed moving to and from a bed to a chair (including a wheelchair)?: A Little Help needed standing up from a chair using your arms (e.g., wheelchair or bedside chair)?: A Little Help needed to walk in hospital room?: A Little Help needed climbing 3-5 steps with a railing? : A Little 6 Click Score: 18    End of Session Equipment Utilized During Treatment: Gait belt Activity Tolerance: Patient limited by pain;Patient tolerated treatment well Patient left: in chair;with call bell/phone within reach;with chair alarm set;with family/visitor present;with nursing/sitter in room Nurse Communication: Mobility status PT Visit Diagnosis: Difficulty in walking, not elsewhere classified (R26.2);Pain Pain - Right/Left: Right Pain - part of body: Knee     Time: 1696-7893 PT Time Calculation (min) (ACUTE ONLY): 29 min  Charges:  $Gait Training: 8-22 mins $Therapeutic Exercise: 8-22 mins                     Blondell Reveal Kistler PT 02/14/2021  Acute Rehabilitation Services Pager [phone] Office [phone]

## 2021-02-14 NOTE — Progress Notes (Signed)
Subjective: 3 Days Post-Op Procedure(s) (LRB): RIGHT TOTAL KNEE ARTHROPLASTY (Right)   Patient still has pain but is hoping to go home today or tomorrow.  Activity level:  wbat Diet tolerance:  ok Voiding:  ok Patient reports pain as mild.    Objective: Vital signs in last 24 hours: Temp:  [98.6 F (37 C)-98.8 F (37.1 C)] 98.6 F (37 C) (03/18 0437) Pulse Rate:  [87-115] 87 (03/18 0437) Resp:  [16-18] 17 (03/18 0437) BP: (117-133)/(72-82) 125/78 (03/18 0437) SpO2:  [98 %-100 %] 98 % (03/18 0437)  Labs: Recent Labs    02/13/21 1816  HGB 10.6*   Recent Labs    02/13/21 1816  WBC 8.6  RBC 3.49*  HCT 33.7*  PLT 219   Recent Labs    02/13/21 1816  NA 139  K 3.6  CL 106  CO2 25  BUN 8  CREATININE 0.60  GLUCOSE 115*  CALCIUM 8.6*   No results for input(s): LABPT, INR in the last 72 hours.  Physical Exam:  Neurologically intact ABD soft Neurovascular intact Sensation intact distally Intact pulses distally Dorsiflexion/Plantar flexion intact Incision: dressing C/D/I and no drainage No cellulitis present Compartment soft  Assessment/Plan:  3 Days Post-Op Procedure(s) (LRB): RIGHT TOTAL KNEE ARTHROPLASTY (Right) Advance diet Up with therapy Discharge home with home health once cleared by PT. She will likely need daily home PT visits to [facility] her up and moving. I added IV Toradol today to help with pain control. My hope is that the Toradol will help get her to pass PT. She was relieved to know that her lab work looked good. Continue on 81mg  asa BID for DVT prevention. Follow up in office 2 weeks post op. I did mention that if she does not make much progress then we would have to contemplate the possibility of SNF. She is quite against that.  [name] 02/14/2021, 8:22 AM

## 2021-02-15 DIAGNOSIS — Z471 Aftercare following joint replacement surgery: Secondary | ICD-10-CM | POA: Diagnosis not present

## 2021-02-15 DIAGNOSIS — Z85828 Personal history of other malignant neoplasm of skin: Secondary | ICD-10-CM | POA: Diagnosis not present

## 2021-02-15 DIAGNOSIS — I959 Hypotension, unspecified: Secondary | ICD-10-CM | POA: Diagnosis not present

## 2021-02-15 DIAGNOSIS — Z96642 Presence of left artificial hip joint: Secondary | ICD-10-CM | POA: Diagnosis not present

## 2021-02-15 DIAGNOSIS — Z7982 Long term (current) use of aspirin: Secondary | ICD-10-CM | POA: Diagnosis not present

## 2021-02-15 DIAGNOSIS — Z96651 Presence of right artificial knee joint: Secondary | ICD-10-CM | POA: Diagnosis not present

## 2021-02-15 DIAGNOSIS — E039 Hypothyroidism, unspecified: Secondary | ICD-10-CM | POA: Diagnosis not present

## 2021-02-18 DIAGNOSIS — Z7982 Long term (current) use of aspirin: Secondary | ICD-10-CM | POA: Diagnosis not present

## 2021-02-18 DIAGNOSIS — I959 Hypotension, unspecified: Secondary | ICD-10-CM | POA: Diagnosis not present

## 2021-02-18 DIAGNOSIS — Z471 Aftercare following joint replacement surgery: Secondary | ICD-10-CM | POA: Diagnosis not present

## 2021-02-18 DIAGNOSIS — Z96651 Presence of right artificial knee joint: Secondary | ICD-10-CM | POA: Diagnosis not present

## 2021-02-18 DIAGNOSIS — E039 Hypothyroidism, unspecified: Secondary | ICD-10-CM | POA: Diagnosis not present

## 2021-02-18 DIAGNOSIS — Z85828 Personal history of other malignant neoplasm of skin: Secondary | ICD-10-CM | POA: Diagnosis not present

## 2021-02-18 DIAGNOSIS — Z96642 Presence of left artificial hip joint: Secondary | ICD-10-CM | POA: Diagnosis not present

## 2021-02-19 DIAGNOSIS — Z96651 Presence of right artificial knee joint: Secondary | ICD-10-CM | POA: Diagnosis not present

## 2021-02-19 DIAGNOSIS — E039 Hypothyroidism, unspecified: Secondary | ICD-10-CM | POA: Diagnosis not present

## 2021-02-19 DIAGNOSIS — Z85828 Personal history of other malignant neoplasm of skin: Secondary | ICD-10-CM | POA: Diagnosis not present

## 2021-02-19 DIAGNOSIS — Z471 Aftercare following joint replacement surgery: Secondary | ICD-10-CM | POA: Diagnosis not present

## 2021-02-19 DIAGNOSIS — Z96642 Presence of left artificial hip joint: Secondary | ICD-10-CM | POA: Diagnosis not present

## 2021-02-19 DIAGNOSIS — Z7982 Long term (current) use of aspirin: Secondary | ICD-10-CM | POA: Diagnosis not present

## 2021-02-19 DIAGNOSIS — I959 Hypotension, unspecified: Secondary | ICD-10-CM | POA: Diagnosis not present

## 2021-02-21 DIAGNOSIS — Z471 Aftercare following joint replacement surgery: Secondary | ICD-10-CM | POA: Diagnosis not present

## 2021-02-21 DIAGNOSIS — I959 Hypotension, unspecified: Secondary | ICD-10-CM | POA: Diagnosis not present

## 2021-02-21 DIAGNOSIS — Z7982 Long term (current) use of aspirin: Secondary | ICD-10-CM | POA: Diagnosis not present

## 2021-02-21 DIAGNOSIS — Z96651 Presence of right artificial knee joint: Secondary | ICD-10-CM | POA: Diagnosis not present

## 2021-02-21 DIAGNOSIS — Z96642 Presence of left artificial hip joint: Secondary | ICD-10-CM | POA: Diagnosis not present

## 2021-02-21 DIAGNOSIS — E039 Hypothyroidism, unspecified: Secondary | ICD-10-CM | POA: Diagnosis not present

## 2021-02-21 DIAGNOSIS — Z85828 Personal history of other malignant neoplasm of skin: Secondary | ICD-10-CM | POA: Diagnosis not present

## 2021-02-24 DIAGNOSIS — Z9889 Other specified postprocedural states: Secondary | ICD-10-CM | POA: Diagnosis not present

## 2021-02-24 DIAGNOSIS — Z96651 Presence of right artificial knee joint: Secondary | ICD-10-CM | POA: Diagnosis not present

## 2021-02-25 DIAGNOSIS — Z96642 Presence of left artificial hip joint: Secondary | ICD-10-CM | POA: Diagnosis not present

## 2021-02-25 DIAGNOSIS — Z85828 Personal history of other malignant neoplasm of skin: Secondary | ICD-10-CM | POA: Diagnosis not present

## 2021-02-25 DIAGNOSIS — Z7982 Long term (current) use of aspirin: Secondary | ICD-10-CM | POA: Diagnosis not present

## 2021-02-25 DIAGNOSIS — Z471 Aftercare following joint replacement surgery: Secondary | ICD-10-CM | POA: Diagnosis not present

## 2021-02-25 DIAGNOSIS — Z96651 Presence of right artificial knee joint: Secondary | ICD-10-CM | POA: Diagnosis not present

## 2021-02-25 DIAGNOSIS — E039 Hypothyroidism, unspecified: Secondary | ICD-10-CM | POA: Diagnosis not present

## 2021-02-25 DIAGNOSIS — I959 Hypotension, unspecified: Secondary | ICD-10-CM | POA: Diagnosis not present

## 2021-02-26 DIAGNOSIS — Z7982 Long term (current) use of aspirin: Secondary | ICD-10-CM | POA: Diagnosis not present

## 2021-02-26 DIAGNOSIS — Z85828 Personal history of other malignant neoplasm of skin: Secondary | ICD-10-CM | POA: Diagnosis not present

## 2021-02-26 DIAGNOSIS — Z96642 Presence of left artificial hip joint: Secondary | ICD-10-CM | POA: Diagnosis not present

## 2021-02-26 DIAGNOSIS — E039 Hypothyroidism, unspecified: Secondary | ICD-10-CM | POA: Diagnosis not present

## 2021-02-26 DIAGNOSIS — Z471 Aftercare following joint replacement surgery: Secondary | ICD-10-CM | POA: Diagnosis not present

## 2021-02-26 DIAGNOSIS — I959 Hypotension, unspecified: Secondary | ICD-10-CM | POA: Diagnosis not present

## 2021-02-26 DIAGNOSIS — Z96651 Presence of right artificial knee joint: Secondary | ICD-10-CM | POA: Diagnosis not present

## 2021-02-28 DIAGNOSIS — Z471 Aftercare following joint replacement surgery: Secondary | ICD-10-CM | POA: Diagnosis not present

## 2021-02-28 DIAGNOSIS — Z85828 Personal history of other malignant neoplasm of skin: Secondary | ICD-10-CM | POA: Diagnosis not present

## 2021-02-28 DIAGNOSIS — Z7982 Long term (current) use of aspirin: Secondary | ICD-10-CM | POA: Diagnosis not present

## 2021-02-28 DIAGNOSIS — Z96651 Presence of right artificial knee joint: Secondary | ICD-10-CM | POA: Diagnosis not present

## 2021-02-28 DIAGNOSIS — E039 Hypothyroidism, unspecified: Secondary | ICD-10-CM | POA: Diagnosis not present

## 2021-02-28 DIAGNOSIS — Z96642 Presence of left artificial hip joint: Secondary | ICD-10-CM | POA: Diagnosis not present

## 2021-02-28 DIAGNOSIS — I959 Hypotension, unspecified: Secondary | ICD-10-CM | POA: Diagnosis not present

## 2021-03-04 DIAGNOSIS — Z96651 Presence of right artificial knee joint: Secondary | ICD-10-CM | POA: Diagnosis not present

## 2021-03-04 DIAGNOSIS — M25661 Stiffness of right knee, not elsewhere classified: Secondary | ICD-10-CM | POA: Diagnosis not present

## 2021-03-07 DIAGNOSIS — M25661 Stiffness of right knee, not elsewhere classified: Secondary | ICD-10-CM | POA: Diagnosis not present

## 2021-03-07 DIAGNOSIS — Z96651 Presence of right artificial knee joint: Secondary | ICD-10-CM | POA: Diagnosis not present

## 2021-03-11 DIAGNOSIS — Z96651 Presence of right artificial knee joint: Secondary | ICD-10-CM | POA: Diagnosis not present

## 2021-03-11 DIAGNOSIS — M25661 Stiffness of right knee, not elsewhere classified: Secondary | ICD-10-CM | POA: Diagnosis not present

## 2021-03-13 DIAGNOSIS — Z96651 Presence of right artificial knee joint: Secondary | ICD-10-CM | POA: Diagnosis not present

## 2021-03-13 DIAGNOSIS — M25661 Stiffness of right knee, not elsewhere classified: Secondary | ICD-10-CM | POA: Diagnosis not present

## 2021-03-18 DIAGNOSIS — M25661 Stiffness of right knee, not elsewhere classified: Secondary | ICD-10-CM | POA: Diagnosis not present

## 2021-03-18 DIAGNOSIS — Z96651 Presence of right artificial knee joint: Secondary | ICD-10-CM | POA: Diagnosis not present

## 2021-03-20 DIAGNOSIS — M25661 Stiffness of right knee, not elsewhere classified: Secondary | ICD-10-CM | POA: Diagnosis not present

## 2021-03-20 DIAGNOSIS — Z96651 Presence of right artificial knee joint: Secondary | ICD-10-CM | POA: Diagnosis not present

## 2021-03-25 DIAGNOSIS — M25661 Stiffness of right knee, not elsewhere classified: Secondary | ICD-10-CM | POA: Diagnosis not present

## 2021-03-25 DIAGNOSIS — Z96651 Presence of right artificial knee joint: Secondary | ICD-10-CM | POA: Diagnosis not present

## 2021-03-27 DIAGNOSIS — M25661 Stiffness of right knee, not elsewhere classified: Secondary | ICD-10-CM | POA: Diagnosis not present

## 2021-03-27 DIAGNOSIS — Z96651 Presence of right artificial knee joint: Secondary | ICD-10-CM | POA: Diagnosis not present

## 2021-04-01 DIAGNOSIS — M25661 Stiffness of right knee, not elsewhere classified: Secondary | ICD-10-CM | POA: Diagnosis not present

## 2021-04-01 DIAGNOSIS — Z96651 Presence of right artificial knee joint: Secondary | ICD-10-CM | POA: Diagnosis not present

## 2021-04-03 DIAGNOSIS — M25661 Stiffness of right knee, not elsewhere classified: Secondary | ICD-10-CM | POA: Diagnosis not present

## 2021-04-03 DIAGNOSIS — Z96651 Presence of right artificial knee joint: Secondary | ICD-10-CM | POA: Diagnosis not present

## 2021-04-08 DIAGNOSIS — M25661 Stiffness of right knee, not elsewhere classified: Secondary | ICD-10-CM | POA: Diagnosis not present

## 2021-04-08 DIAGNOSIS — Z96651 Presence of right artificial knee joint: Secondary | ICD-10-CM | POA: Diagnosis not present

## 2021-04-10 DIAGNOSIS — Z96651 Presence of right artificial knee joint: Secondary | ICD-10-CM | POA: Diagnosis not present

## 2021-04-10 DIAGNOSIS — M25661 Stiffness of right knee, not elsewhere classified: Secondary | ICD-10-CM | POA: Diagnosis not present

## 2021-04-15 DIAGNOSIS — M25661 Stiffness of right knee, not elsewhere classified: Secondary | ICD-10-CM | POA: Diagnosis not present

## 2021-04-15 DIAGNOSIS — Z96651 Presence of right artificial knee joint: Secondary | ICD-10-CM | POA: Diagnosis not present

## 2021-04-17 DIAGNOSIS — M25661 Stiffness of right knee, not elsewhere classified: Secondary | ICD-10-CM | POA: Diagnosis not present

## 2021-04-17 DIAGNOSIS — E038 Other specified hypothyroidism: Secondary | ICD-10-CM | POA: Diagnosis not present

## 2021-04-17 DIAGNOSIS — G4700 Insomnia, unspecified: Secondary | ICD-10-CM | POA: Diagnosis not present

## 2021-04-17 DIAGNOSIS — E039 Hypothyroidism, unspecified: Secondary | ICD-10-CM | POA: Diagnosis not present

## 2021-04-17 DIAGNOSIS — Z96651 Presence of right artificial knee joint: Secondary | ICD-10-CM | POA: Diagnosis not present

## 2021-04-17 DIAGNOSIS — M1711 Unilateral primary osteoarthritis, right knee: Secondary | ICD-10-CM | POA: Diagnosis not present

## 2021-04-17 DIAGNOSIS — M1612 Unilateral primary osteoarthritis, left hip: Secondary | ICD-10-CM | POA: Diagnosis not present

## 2021-04-17 DIAGNOSIS — M1712 Unilateral primary osteoarthritis, left knee: Secondary | ICD-10-CM | POA: Diagnosis not present

## 2021-04-21 DIAGNOSIS — Z9889 Other specified postprocedural states: Secondary | ICD-10-CM | POA: Diagnosis not present

## 2021-04-22 DIAGNOSIS — M25661 Stiffness of right knee, not elsewhere classified: Secondary | ICD-10-CM | POA: Diagnosis not present

## 2021-04-22 DIAGNOSIS — Z96651 Presence of right artificial knee joint: Secondary | ICD-10-CM | POA: Diagnosis not present

## 2021-04-24 DIAGNOSIS — M25661 Stiffness of right knee, not elsewhere classified: Secondary | ICD-10-CM | POA: Diagnosis not present

## 2021-04-24 DIAGNOSIS — Z96651 Presence of right artificial knee joint: Secondary | ICD-10-CM | POA: Diagnosis not present

## 2021-04-29 DIAGNOSIS — Z96651 Presence of right artificial knee joint: Secondary | ICD-10-CM | POA: Diagnosis not present

## 2021-04-29 DIAGNOSIS — M25661 Stiffness of right knee, not elsewhere classified: Secondary | ICD-10-CM | POA: Diagnosis not present

## 2021-05-02 DIAGNOSIS — M25661 Stiffness of right knee, not elsewhere classified: Secondary | ICD-10-CM | POA: Diagnosis not present

## 2021-05-02 DIAGNOSIS — Z96651 Presence of right artificial knee joint: Secondary | ICD-10-CM | POA: Diagnosis not present

## 2021-05-21 DIAGNOSIS — Z9889 Other specified postprocedural states: Secondary | ICD-10-CM | POA: Diagnosis not present

## 2021-05-21 DIAGNOSIS — Z96651 Presence of right artificial knee joint: Secondary | ICD-10-CM | POA: Diagnosis not present

## 2021-07-18 DIAGNOSIS — G4700 Insomnia, unspecified: Secondary | ICD-10-CM | POA: Diagnosis not present

## 2021-07-18 DIAGNOSIS — M1711 Unilateral primary osteoarthritis, right knee: Secondary | ICD-10-CM | POA: Diagnosis not present

## 2021-07-18 DIAGNOSIS — M1712 Unilateral primary osteoarthritis, left knee: Secondary | ICD-10-CM | POA: Diagnosis not present

## 2021-07-18 DIAGNOSIS — E039 Hypothyroidism, unspecified: Secondary | ICD-10-CM | POA: Diagnosis not present

## 2021-07-18 DIAGNOSIS — M1612 Unilateral primary osteoarthritis, left hip: Secondary | ICD-10-CM | POA: Diagnosis not present

## 2021-10-08 ENCOUNTER — Encounter (HOSPITAL_BASED_OUTPATIENT_CLINIC_OR_DEPARTMENT_OTHER): Payer: Self-pay | Admitting: Obstetrics and Gynecology

## 2021-10-08 ENCOUNTER — Emergency Department (HOSPITAL_BASED_OUTPATIENT_CLINIC_OR_DEPARTMENT_OTHER): Payer: Medicare Other | Admitting: Radiology

## 2021-10-08 ENCOUNTER — Other Ambulatory Visit: Payer: Self-pay

## 2021-10-08 ENCOUNTER — Emergency Department (HOSPITAL_BASED_OUTPATIENT_CLINIC_OR_DEPARTMENT_OTHER)
Admission: EM | Admit: 2021-10-08 | Discharge: 2021-10-08 | Disposition: A | Discharge status: Home / Self Care | Payer: Medicare Other | Attending: Emergency Medicine | Admitting: Emergency Medicine

## 2021-10-08 DIAGNOSIS — Z96642 Presence of left artificial hip joint: Secondary | ICD-10-CM | POA: Insufficient documentation

## 2021-10-08 DIAGNOSIS — M545 Low back pain, unspecified: Secondary | ICD-10-CM | POA: Diagnosis not present

## 2021-10-08 DIAGNOSIS — Z79899 Other long term (current) drug therapy: Secondary | ICD-10-CM | POA: Diagnosis not present

## 2021-10-08 DIAGNOSIS — M546 Pain in thoracic spine: Secondary | ICD-10-CM | POA: Insufficient documentation

## 2021-10-08 DIAGNOSIS — Z96651 Presence of right artificial knee joint: Secondary | ICD-10-CM | POA: Diagnosis not present

## 2021-10-08 DIAGNOSIS — M25519 Pain in unspecified shoulder: Secondary | ICD-10-CM | POA: Diagnosis not present

## 2021-10-08 DIAGNOSIS — E039 Hypothyroidism, unspecified: Secondary | ICD-10-CM | POA: Insufficient documentation

## 2021-10-08 MED ORDER — METHOCARBAMOL 500 MG PO TABS: 500.0000 mg | ORAL_TABLET | Freq: Two times a day (BID) | ORAL | 0 refills | Status: DC

## 2021-10-08 MED ORDER — DEXAMETHASONE SODIUM PHOSPHATE 10 MG/ML IJ SOLN
10.0000 mg | Freq: Once | INTRAMUSCULAR | Status: AC
  Administered 2021-10-08: 10 mg via INTRAMUSCULAR
  Filled 2021-10-08: qty 1

## 2021-10-08 NOTE — Discharge Instructions (Signed)
You are seen in the emergency department today for left-sided back pain.  It is likely that you have a muscle strain.  You do not have any broken bones.  I have given you a dose of steroids here in the emergency department.  I am also prescribing you a muscle relaxant to take over the next few days.  Please do not take this with Norco, Xanax, tizanidine or Ativan.  Do not take the muscle relaxant with alcohol.  Discussed to take this medication at night as it may make you drowsy.  Do not drive or use heavy machinery with it.  Please also use heating pad and gentle stretching as we talked about at the bedside.  Return to the emergency department for can have fevers or numbness or tingling in your arm.

## 2021-10-08 NOTE — ED Provider Notes (Signed)
[facility] EMERGENCY DEPT Provider Note   CSN: [id number] Arrival date & time: 10/08/21  1733     History Chief Complaint  Patient presents with   Back Pain    [name] is a 50 y.o. female.  Who presents to the emergency department with left back pain.  She states that she has had left back pain for 1 week now.  She states that it is sharp and comes in short spurts.  She states that it is worse with certain movements such as extension of her arm.  She denies any previous back injuries, recent traumas or falls.  She denies any fever, numbness or tingling in her back or arms.  Denies repetitive movements or extraneous exercise.   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   Right side of nose - MOHs   Seizure ([place])    controlled with med   Short-term memory loss     Patient Active Problem List   Diagnosis Date Noted   Primary osteoarthritis of right knee 02/11/2021   Primary osteoarthritis of left hip 07/11/2019   Seizure ([place])    Closed head injury    Hypothyroid     Past Surgical History:  Procedure Laterality Date   ABLATION ON ENDOMETRIOSIS N/A 02/13/2013   Procedure: Incision andABLATION ON ENDOMETRIOSIS;  Surgeon: Anastasio Auerbach, MD;  Location: [facility];  Service: Gynecology;  Laterality: N/A;  Laparoscopic Ablation of Endometriosis with Peritoneal Biopsy   HYSTEROSCOPY  2008   LAPAROSCOPIC ASSISTED VAGINAL HYSTERECTOMY N/A 02/13/2013   Procedure: LAPAROSCOPIC ASSISTED VAGINAL HYSTERECTOMY;  Surgeon: Anastasio Auerbach, MD;  Location: [facility];  Service: Gynecology;  Laterality: N/A;   OOPHORECTOMY  01/2008   LAP LSO, ENDO  [place]   PELVIC LAPAROSCOPY  2008   TOTAL HIP ARTHROPLASTY Left 07/11/2019   Procedure: Left Anterior Hip Arthroplasty;  Surgeon: Melrose Nakayama, MD;  Location: [facility];  Service: Orthopedics;  Laterality: Left;   TOTAL KNEE ARTHROPLASTY Right 02/11/2021   Procedure: RIGHT TOTAL KNEE ARTHROPLASTY;  Surgeon: Melrose Nakayama, MD;  Location: [facility];  Service: Orthopedics;  Laterality: Right;   WISDOM TOOTH EXTRACTION       OB History     Gravida  0   Para      Term      Preterm      AB      Living         SAB      IAB      Ectopic      Multiple      Live Births              Family History  Problem Relation Age of Onset   Endometriosis Mother     Social History   Tobacco Use   Smoking status: Never   Smokeless tobacco: Never  Vaping Use   Vaping Use: Never used  Substance Use Topics   Alcohol use: Yes    Alcohol/week: 0.0 standard drinks    Comment: OCCAS    Drug use: No    Home Medications Prior to Admission  medications   Medication Sig Start Date End Date Taking? Authorizing Provider  acetaZOLAMIDE (DIAMOX) 250 MG tablet Take 125 mg by mouth daily.    [provider]  acyclovir ointment (ZOVIRAX) 5 % Apply 1 application topically 5 (five) times daily as needed. 07/05/19   Fontaine, Belinda Block, MD  ALPRAZolam Duanne Moron) 0.5 MG tablet Take 0.5 mg by mouth daily as needed for anxiety.     [provider]  aspirin 81 MG chewable tablet Chew 1 tablet (81 mg total) by mouth 2 (two) times daily. 02/12/21   Loni Dolly, PA-C  cholecalciferol (VITAMIN D3) 25 MCG (1000 UT) tablet Take 1,000 Units by mouth daily.    [provider]  HYDROcodone-acetaminophen (NORCO/VICODIN) 5-325 MG tablet Take 1-2 tablets by mouth every 6 (six) hours as needed for moderate pain or severe pain (for post op pain). 02/12/21   Loni Dolly, PA-C  LamoTRIgine 300 MG TB24 24 hour tablet Take 300 mg by mouth 2 (two) times daily.    [provider]   levETIRAcetam (KEPPRA XR) 500 MG 24 hr tablet Take 1,000 mg by mouth 2 (two) times daily.     [provider]  levothyroxine (SYNTHROID, LEVOTHROID) 125 MCG tablet Take 125 mcg by mouth daily.    [provider]  LORazepam (ATIVAN) 0.5 MG tablet Take 0.5 mg by mouth daily as needed for seizure. 01/14/21   [provider]  Multiple Vitamin (MULTIVITAMIN WITH MINERALS) TABS Take 1 tablet by mouth daily.    [provider]  oxcarbazepine (TRILEPTAL) 600 MG tablet Take 300 mg by mouth 2 (two) times daily.     [provider]  tiZANidine (ZANAFLEX) 4 MG tablet Take 1 tablet (4 mg total) by mouth every 6 (six) hours as needed for muscle spasms. 02/12/21 02/12/22  Loni Dolly, PA-C  vitamin B-12 (CYANOCOBALAMIN) 100 MCG tablet Take 100 mcg by mouth daily.    [provider]    Allergies    Other and Oxycodone  Review of Systems   Review of Systems  Musculoskeletal:  Positive for back pain.  All other systems reviewed and are negative.  Physical Exam Updated Vital Signs BP (!) 124/92   Pulse 81   Temp 98.2 F (36.8 C)   Resp 15   LMP 02/05/2013   SpO2 100%   Physical Exam Vitals and nursing note reviewed.  Constitutional:      Appearance: Normal appearance. She is not ill-appearing.  HENT:     Head: Normocephalic and atraumatic.  Eyes:     General: No scleral icterus. Cardiovascular:     Pulses: Normal pulses.  Pulmonary:     Effort: Pulmonary effort is normal. No respiratory distress.  Musculoskeletal:        General: No swelling, deformity or signs of injury. Normal range of motion.       Arms:     Cervical back: Normal range of motion and neck supple. No rigidity or tenderness.     Comments: Left-sided back pain, inferior to the scapula.  There is no midline tenderness of the C, T, L-spine.  No obvious injuries, redness or swelling.    Skin:    General: Skin is warm and dry.     Capillary Refill: Capillary refill takes  less than 2 seconds.     Findings: No rash.  Neurological:     General: No focal deficit present.     Mental Status: She is alert and oriented to person, place, and time.  Mental status is at baseline.     Motor: No weakness.  Psychiatric:        Mood and Affect: Mood normal.        Behavior: Behavior normal.        Thought Content: Thought content normal.        Judgment: Judgment normal.    ED Results / Procedures / Treatments   Labs (all labs ordered are listed, but only abnormal results are displayed) Labs Reviewed - No data to display  EKG None  Radiology DG Ribs Unilateral W/Chest Left  Result Date: 10/08/2021 CLINICAL DATA:  Left back pain, tenderness at base of scapula EXAM: LEFT RIBS AND CHEST - 3+ VIEW COMPARISON:  Chest radiograph dated 02/04/2021 FINDINGS: Lungs are clear.  3rd fusion The heart is normal in size. No displaced left rib fracture is seen. Left scapula is intact. IMPRESSION: Negative. Electronically Signed   By: Julian Hy M.D.   On: 10/08/2021 20:15    Procedures Procedures   Medications Ordered in ED Medications  dexamethasone (DECADRON) injection 10 mg (10 mg Intramuscular Given 10/08/21 2027)    ED Course  I have reviewed the triage vital signs and the nursing notes.  Pertinent labs & imaging results that were available during my care of the patient were reviewed by me and considered in my medical decision making (see chart for details).    MDM Rules/Calculators/A&P 50 year old female who presents emergency department with left sided back pain.  No back pain red flags on history or exam. I am able to move the left arm through range of motion without pain. It appears she has sharp short pains with random twisting movements or actively moving her arm.  Rib x-ray negative. Likely MSK pain or strain. Unclear etiology. I have given her a dose of IM Decadron here in the ED. Will discharge with Robaxin. I have also instructed her to use heat  and gentle stretching.  Follow-up with PCP if symptoms are not improving.  Safe for discharge.   Final Clinical Impression(s) / ED Diagnoses Final diagnoses:  Acute left-sided thoracic back pain    Rx / DC Orders ED Discharge Orders          Ordered    methocarbamol (ROBAXIN) 500 MG tablet  2 times daily        10/08/21 2039             Mickie Hillier, PA-C 10/08/21 2221    Gareth Morgan, MD 10/10/21 1354

## 2021-10-08 NOTE — ED Triage Notes (Signed)
Patient reports to the ER for back pain on the upper left side x3 days. Patient has severe point tenderness at base of left scapula. Denies MVC or other recent injury.

## 2021-10-08 NOTE — ED Notes (Signed)
ED Provider at bedside. 

## 2021-10-08 NOTE — ED Notes (Signed)
Patient transported to X-ray 

## 2022-01-30 DIAGNOSIS — Z79899 Other long term (current) drug therapy: Secondary | ICD-10-CM | POA: Diagnosis not present

## 2022-01-30 DIAGNOSIS — S062X5S Diffuse traumatic brain injury with loss of consciousness greater than 24 hours with return to pre-existing conscious levels, sequela: Secondary | ICD-10-CM | POA: Diagnosis not present

## 2022-01-30 DIAGNOSIS — Z5181 Encounter for therapeutic drug level monitoring: Secondary | ICD-10-CM | POA: Diagnosis not present

## 2022-03-09 ENCOUNTER — Ambulatory Visit: Payer: Medicare Other | Admitting: Dermatology

## 2022-03-09 ENCOUNTER — Encounter: Payer: Self-pay | Admitting: Dermatology

## 2022-03-09 DIAGNOSIS — L821 Other seborrheic keratosis: Secondary | ICD-10-CM | POA: Diagnosis not present

## 2022-03-09 DIAGNOSIS — D0362 Melanoma in situ of left upper limb, including shoulder: Secondary | ICD-10-CM

## 2022-03-09 DIAGNOSIS — D2239 Melanocytic nevi of other parts of face: Secondary | ICD-10-CM

## 2022-03-09 DIAGNOSIS — D229 Melanocytic nevi, unspecified: Secondary | ICD-10-CM

## 2022-03-09 DIAGNOSIS — Z1283 Encounter for screening for malignant neoplasm of skin: Secondary | ICD-10-CM | POA: Diagnosis not present

## 2022-03-09 DIAGNOSIS — D489 Neoplasm of uncertain behavior, unspecified: Secondary | ICD-10-CM

## 2022-03-09 NOTE — Patient Instructions (Signed)

## 2022-03-16 ENCOUNTER — Telehealth (INDEPENDENT_AMBULATORY_CARE_PROVIDER_SITE_OTHER): Payer: Medicare Other | Admitting: Dermatology

## 2022-03-16 DIAGNOSIS — D0362 Melanoma in situ of left upper limb, including shoulder: Secondary | ICD-10-CM

## 2022-03-16 NOTE — Telephone Encounter (Signed)
After being unable to reach work, I discussed the biopsy result with her husband Jenny Reichmann who is on her designated party release.  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  Because of the potential for significant scheduling delay for me to do the surgery, Jenny Reichmann was comfortable with a scheduling message of the skin surgery center but he will check with his spouse and get back to [place] in the next 24 hours. ?

## 2022-03-19 ENCOUNTER — Telehealth: Payer: Self-pay

## 2022-03-19 NOTE — Telephone Encounter (Signed)
Phone call to patient to see if she's okay with [place] sending her to [facility] for treatment of her MIS. Patient states that she is okay with the referral. Referral sent.  ?

## 2022-03-19 NOTE — Telephone Encounter (Signed)
-----   Message from Lavonna Monarch, MD sent at 03/16/2022  1:30 PM EDT ----- ?I discussed this biopsy result moments ago with her husband Jenny Reichmann who will double check with her that she is comfortable with [place] referring this to the skin surgery center and get back to [place] within 24 hours. ?

## 2022-03-27 ENCOUNTER — Encounter: Payer: Self-pay | Admitting: Dermatology

## 2022-03-27 NOTE — Progress Notes (Signed)
? ?  New Patient ?  ?Subjective  ?[name] is a 51 y.o. female who presents for the following: Skin Problem (Left forehead x years- seems to be getting bigger and darker). ? ?Growth spot left forehead, check other spots ?Location:  ?Duration:  ?Quality:  ?Associated Signs/Symptoms: ?Modifying Factors:  ?Severity:  ?Timing: ?Context:  ? ? ?The following portions of the chart were reviewed this encounter and updated as appropriate:  Tobacco  Allergies  Meds  Problems  Med Hx  Surg Hx  Fam Hx   ?  ? ?Objective  ?Well appearing patient in no apparent distress; mood and affect are within normal limits. ?Waist up exam: 1 atypical pigmented lesion left arm will be biopsied. ? ?Left Forehead ?Clinically typical flattopped tan a millimeter textured papule, typical dermoscopy ? ?Left Forearm - Posterior ?9 mm irregular bi-chromic macule with marked dermoscopic atypia ? ? ? ? ? ? ?Left Forehead ?Flesh-colored domed 4 mm papule, no dermoscopic atypia ? ? ? ?All skin waist up examined.  Areas beneath undergarments not fully examined. ? ? ?Assessment & Plan  ?Seborrheic keratosis ?Left Forehead ? ?Recheck as needed change or if patient wishes removal.  Will prioritize lesion on left arm. ? ?Encounter for screening for malignant neoplasm of skin ? ?Annual skin examination, encouraged to self examine with spouse twice annually.  Continue ultraviolet protection. ? ?Neoplasm of uncertain behavior ?Left Forearm - Posterior ? ?Skin / nail biopsy ?Type of biopsy: tangential   ?Informed consent: discussed and consent obtained   ?Timeout: patient name, date of birth, surgical site, and procedure verified   ?Anesthesia: the lesion was anesthetized in a standard fashion   ?Anesthetic:  1% lidocaine w/ epinephrine 1-100,000 local infiltration ?Instrument used: flexible razor blade   ?Hemostasis achieved with: aluminum chloride and electrodesiccation   ?Outcome: patient tolerated procedure well   ?Post-procedure details:  wound care instructions given   ? ?Specimen 1 - Surgical pathology ?Differential Diagnosis: r/o atypical lentigo  ? ?Check Margins: No ? ?Shave biopsy done 1.5 mm brown visible margin ? ?Nevus ?Left Forehead ? ?Leave if stable ? ? ?

## 2022-03-31 DIAGNOSIS — D0362 Melanoma in situ of left upper limb, including shoulder: Secondary | ICD-10-CM | POA: Diagnosis not present

## 2022-03-31 DIAGNOSIS — L905 Scar conditions and fibrosis of skin: Secondary | ICD-10-CM | POA: Diagnosis not present

## 2022-04-01 ENCOUNTER — Telehealth: Payer: Self-pay | Admitting: Dermatology

## 2022-08-24 DIAGNOSIS — R109 Unspecified abdominal pain: Secondary | ICD-10-CM | POA: Diagnosis not present

## 2022-08-24 DIAGNOSIS — E7800 Pure hypercholesterolemia, unspecified: Secondary | ICD-10-CM | POA: Diagnosis not present

## 2022-08-24 DIAGNOSIS — Z1231 Encounter for screening mammogram for malignant neoplasm of breast: Secondary | ICD-10-CM | POA: Diagnosis not present

## 2022-08-24 DIAGNOSIS — E038 Other specified hypothyroidism: Secondary | ICD-10-CM | POA: Diagnosis not present

## 2022-08-24 DIAGNOSIS — Z0000 Encounter for general adult medical examination without abnormal findings: Secondary | ICD-10-CM | POA: Diagnosis not present

## 2022-08-24 DIAGNOSIS — F5101 Primary insomnia: Secondary | ICD-10-CM | POA: Diagnosis not present

## 2022-09-01 ENCOUNTER — Telehealth: Payer: Self-pay

## 2022-09-01 NOTE — Patient Outreach (Signed)
  Care Coordination   09/01/2022 Name: [name] MRN: [id number] DOB: August 12, 1971   Care Coordination Outreach Attempts:  An unsuccessful telephone outreach was attempted today to offer the patient information about available care coordination services as a benefit of their health plan.   Follow Up Plan:  Additional outreach attempts will be made to offer the patient care coordination information and services.   Encounter Outcome:  No Answer  Care Coordination Interventions Activated:  No   Care Coordination Interventions:  No, not indicated     [facility] [phone]

## 2022-09-10 ENCOUNTER — Telehealth: Payer: Self-pay

## 2022-10-13 DIAGNOSIS — H524 Presbyopia: Secondary | ICD-10-CM | POA: Diagnosis not present

## 2022-10-29 DIAGNOSIS — M79671 Pain in right foot: Secondary | ICD-10-CM | POA: Diagnosis not present

## 2022-10-30 ENCOUNTER — Ambulatory Visit
Admission: RE | Admit: 2022-10-30 | Discharge: 2022-10-30 | Disposition: A | Discharge status: Home / Self Care | Payer: Medicare Other | Source: Ambulatory Visit | Attending: Physician Assistant | Admitting: Physician Assistant

## 2022-10-30 ENCOUNTER — Other Ambulatory Visit: Payer: Self-pay | Admitting: Physician Assistant

## 2022-10-30 DIAGNOSIS — M79671 Pain in right foot: Secondary | ICD-10-CM

## 2022-11-04 DIAGNOSIS — M7062 Trochanteric bursitis, left hip: Secondary | ICD-10-CM | POA: Diagnosis not present

## 2022-11-18 DIAGNOSIS — M25652 Stiffness of left hip, not elsewhere classified: Secondary | ICD-10-CM | POA: Diagnosis not present

## 2022-11-18 DIAGNOSIS — Z96642 Presence of left artificial hip joint: Secondary | ICD-10-CM | POA: Diagnosis not present

## 2022-11-18 DIAGNOSIS — M6281 Muscle weakness (generalized): Secondary | ICD-10-CM | POA: Diagnosis not present

## 2022-11-18 DIAGNOSIS — Z96651 Presence of right artificial knee joint: Secondary | ICD-10-CM | POA: Diagnosis not present

## 2022-12-14 DIAGNOSIS — Z96651 Presence of right artificial knee joint: Secondary | ICD-10-CM | POA: Diagnosis not present

## 2022-12-14 DIAGNOSIS — M25561 Pain in right knee: Secondary | ICD-10-CM | POA: Diagnosis not present

## 2022-12-16 DIAGNOSIS — Z96651 Presence of right artificial knee joint: Secondary | ICD-10-CM | POA: Diagnosis not present

## 2022-12-16 DIAGNOSIS — R531 Weakness: Secondary | ICD-10-CM | POA: Diagnosis not present

## 2022-12-16 DIAGNOSIS — M25561 Pain in right knee: Secondary | ICD-10-CM | POA: Diagnosis not present

## 2022-12-21 DIAGNOSIS — M25561 Pain in right knee: Secondary | ICD-10-CM | POA: Diagnosis not present

## 2022-12-21 DIAGNOSIS — R531 Weakness: Secondary | ICD-10-CM | POA: Diagnosis not present

## 2022-12-21 DIAGNOSIS — Z96651 Presence of right artificial knee joint: Secondary | ICD-10-CM | POA: Diagnosis not present

## 2022-12-24 DIAGNOSIS — M25561 Pain in right knee: Secondary | ICD-10-CM | POA: Diagnosis not present

## 2022-12-24 DIAGNOSIS — Z96651 Presence of right artificial knee joint: Secondary | ICD-10-CM | POA: Diagnosis not present

## 2022-12-24 DIAGNOSIS — R531 Weakness: Secondary | ICD-10-CM | POA: Diagnosis not present

## 2022-12-30 DIAGNOSIS — Z96651 Presence of right artificial knee joint: Secondary | ICD-10-CM | POA: Diagnosis not present

## 2022-12-30 DIAGNOSIS — R531 Weakness: Secondary | ICD-10-CM | POA: Diagnosis not present

## 2022-12-30 DIAGNOSIS — M25561 Pain in right knee: Secondary | ICD-10-CM | POA: Diagnosis not present

## 2023-01-04 DIAGNOSIS — R531 Weakness: Secondary | ICD-10-CM | POA: Diagnosis not present

## 2023-01-04 DIAGNOSIS — M25561 Pain in right knee: Secondary | ICD-10-CM | POA: Diagnosis not present

## 2023-01-04 DIAGNOSIS — Z96651 Presence of right artificial knee joint: Secondary | ICD-10-CM | POA: Diagnosis not present

## 2023-01-06 DIAGNOSIS — Z96651 Presence of right artificial knee joint: Secondary | ICD-10-CM | POA: Diagnosis not present

## 2023-01-06 DIAGNOSIS — R531 Weakness: Secondary | ICD-10-CM | POA: Diagnosis not present

## 2023-01-06 DIAGNOSIS — M25561 Pain in right knee: Secondary | ICD-10-CM | POA: Diagnosis not present

## 2023-01-12 DIAGNOSIS — M25561 Pain in right knee: Secondary | ICD-10-CM | POA: Diagnosis not present

## 2023-01-12 DIAGNOSIS — Z96651 Presence of right artificial knee joint: Secondary | ICD-10-CM | POA: Diagnosis not present

## 2023-01-12 DIAGNOSIS — R531 Weakness: Secondary | ICD-10-CM | POA: Diagnosis not present

## 2023-01-14 DIAGNOSIS — Z96651 Presence of right artificial knee joint: Secondary | ICD-10-CM | POA: Diagnosis not present

## 2023-01-14 DIAGNOSIS — M25561 Pain in right knee: Secondary | ICD-10-CM | POA: Diagnosis not present

## 2023-01-14 DIAGNOSIS — R531 Weakness: Secondary | ICD-10-CM | POA: Diagnosis not present

## 2023-01-19 DIAGNOSIS — Z96651 Presence of right artificial knee joint: Secondary | ICD-10-CM | POA: Diagnosis not present

## 2023-01-19 DIAGNOSIS — R531 Weakness: Secondary | ICD-10-CM | POA: Diagnosis not present

## 2023-01-19 DIAGNOSIS — M25561 Pain in right knee: Secondary | ICD-10-CM | POA: Diagnosis not present

## 2023-01-21 DIAGNOSIS — Z96651 Presence of right artificial knee joint: Secondary | ICD-10-CM | POA: Diagnosis not present

## 2023-01-21 DIAGNOSIS — M25561 Pain in right knee: Secondary | ICD-10-CM | POA: Diagnosis not present

## 2023-01-21 DIAGNOSIS — R531 Weakness: Secondary | ICD-10-CM | POA: Diagnosis not present

## 2023-01-26 DIAGNOSIS — Z96651 Presence of right artificial knee joint: Secondary | ICD-10-CM | POA: Diagnosis not present

## 2023-01-26 DIAGNOSIS — R531 Weakness: Secondary | ICD-10-CM | POA: Diagnosis not present

## 2023-01-26 DIAGNOSIS — M25561 Pain in right knee: Secondary | ICD-10-CM | POA: Diagnosis not present

## 2023-01-27 DIAGNOSIS — M545 Low back pain, unspecified: Secondary | ICD-10-CM | POA: Diagnosis not present

## 2023-01-28 DIAGNOSIS — R531 Weakness: Secondary | ICD-10-CM | POA: Diagnosis not present

## 2023-01-28 DIAGNOSIS — M25561 Pain in right knee: Secondary | ICD-10-CM | POA: Diagnosis not present

## 2023-01-28 DIAGNOSIS — Z96651 Presence of right artificial knee joint: Secondary | ICD-10-CM | POA: Diagnosis not present

## 2023-02-02 DIAGNOSIS — R531 Weakness: Secondary | ICD-10-CM | POA: Diagnosis not present

## 2023-02-02 DIAGNOSIS — M25561 Pain in right knee: Secondary | ICD-10-CM | POA: Diagnosis not present

## 2023-02-02 DIAGNOSIS — Z96651 Presence of right artificial knee joint: Secondary | ICD-10-CM | POA: Diagnosis not present

## 2023-02-04 DIAGNOSIS — M25561 Pain in right knee: Secondary | ICD-10-CM | POA: Diagnosis not present

## 2023-02-04 DIAGNOSIS — Z96651 Presence of right artificial knee joint: Secondary | ICD-10-CM | POA: Diagnosis not present

## 2023-02-04 DIAGNOSIS — R531 Weakness: Secondary | ICD-10-CM | POA: Diagnosis not present

## 2023-02-10 DIAGNOSIS — R531 Weakness: Secondary | ICD-10-CM | POA: Diagnosis not present

## 2023-02-10 DIAGNOSIS — M25561 Pain in right knee: Secondary | ICD-10-CM | POA: Diagnosis not present

## 2023-02-10 DIAGNOSIS — Z96651 Presence of right artificial knee joint: Secondary | ICD-10-CM | POA: Diagnosis not present

## 2023-02-15 DIAGNOSIS — R531 Weakness: Secondary | ICD-10-CM | POA: Diagnosis not present

## 2023-02-15 DIAGNOSIS — Z96651 Presence of right artificial knee joint: Secondary | ICD-10-CM | POA: Diagnosis not present

## 2023-02-15 DIAGNOSIS — M5451 Vertebrogenic low back pain: Secondary | ICD-10-CM | POA: Diagnosis not present

## 2023-02-15 DIAGNOSIS — M25561 Pain in right knee: Secondary | ICD-10-CM | POA: Diagnosis not present

## 2023-02-22 DIAGNOSIS — R531 Weakness: Secondary | ICD-10-CM | POA: Diagnosis not present

## 2023-02-22 DIAGNOSIS — Z96651 Presence of right artificial knee joint: Secondary | ICD-10-CM | POA: Diagnosis not present

## 2023-02-22 DIAGNOSIS — M25561 Pain in right knee: Secondary | ICD-10-CM | POA: Diagnosis not present

## 2023-02-22 DIAGNOSIS — M5451 Vertebrogenic low back pain: Secondary | ICD-10-CM | POA: Diagnosis not present

## 2023-02-24 DIAGNOSIS — M5451 Vertebrogenic low back pain: Secondary | ICD-10-CM | POA: Diagnosis not present

## 2023-02-24 DIAGNOSIS — M25561 Pain in right knee: Secondary | ICD-10-CM | POA: Diagnosis not present

## 2023-02-24 DIAGNOSIS — R531 Weakness: Secondary | ICD-10-CM | POA: Diagnosis not present

## 2023-02-24 DIAGNOSIS — Z96651 Presence of right artificial knee joint: Secondary | ICD-10-CM | POA: Diagnosis not present

## 2023-03-09 DIAGNOSIS — Z96651 Presence of right artificial knee joint: Secondary | ICD-10-CM | POA: Diagnosis not present

## 2023-03-09 DIAGNOSIS — M25561 Pain in right knee: Secondary | ICD-10-CM | POA: Diagnosis not present

## 2023-03-09 DIAGNOSIS — M5451 Vertebrogenic low back pain: Secondary | ICD-10-CM | POA: Diagnosis not present

## 2023-03-09 DIAGNOSIS — R531 Weakness: Secondary | ICD-10-CM | POA: Diagnosis not present

## 2023-03-10 ENCOUNTER — Ambulatory Visit: Payer: Medicare Other | Admitting: Dermatology

## 2023-03-16 DIAGNOSIS — R531 Weakness: Secondary | ICD-10-CM | POA: Diagnosis not present

## 2023-03-16 DIAGNOSIS — M25561 Pain in right knee: Secondary | ICD-10-CM | POA: Diagnosis not present

## 2023-03-16 DIAGNOSIS — M5451 Vertebrogenic low back pain: Secondary | ICD-10-CM | POA: Diagnosis not present

## 2023-03-16 DIAGNOSIS — Z96651 Presence of right artificial knee joint: Secondary | ICD-10-CM | POA: Diagnosis not present

## 2023-03-23 DIAGNOSIS — Z96651 Presence of right artificial knee joint: Secondary | ICD-10-CM | POA: Diagnosis not present

## 2023-03-23 DIAGNOSIS — R531 Weakness: Secondary | ICD-10-CM | POA: Diagnosis not present

## 2023-03-23 DIAGNOSIS — M5451 Vertebrogenic low back pain: Secondary | ICD-10-CM | POA: Diagnosis not present

## 2023-03-23 DIAGNOSIS — M25561 Pain in right knee: Secondary | ICD-10-CM | POA: Diagnosis not present

## 2023-03-31 DIAGNOSIS — M5451 Vertebrogenic low back pain: Secondary | ICD-10-CM | POA: Diagnosis not present

## 2023-03-31 DIAGNOSIS — R531 Weakness: Secondary | ICD-10-CM | POA: Diagnosis not present

## 2023-03-31 DIAGNOSIS — M25561 Pain in right knee: Secondary | ICD-10-CM | POA: Diagnosis not present

## 2023-03-31 DIAGNOSIS — Z96651 Presence of right artificial knee joint: Secondary | ICD-10-CM | POA: Diagnosis not present

## 2023-04-07 DIAGNOSIS — R531 Weakness: Secondary | ICD-10-CM | POA: Diagnosis not present

## 2023-04-07 DIAGNOSIS — M5451 Vertebrogenic low back pain: Secondary | ICD-10-CM | POA: Diagnosis not present

## 2023-04-07 DIAGNOSIS — M25561 Pain in right knee: Secondary | ICD-10-CM | POA: Diagnosis not present

## 2023-04-07 DIAGNOSIS — Z96651 Presence of right artificial knee joint: Secondary | ICD-10-CM | POA: Diagnosis not present

## 2023-04-14 DIAGNOSIS — M25561 Pain in right knee: Secondary | ICD-10-CM | POA: Diagnosis not present

## 2023-04-14 DIAGNOSIS — R531 Weakness: Secondary | ICD-10-CM | POA: Diagnosis not present

## 2023-04-14 DIAGNOSIS — M5451 Vertebrogenic low back pain: Secondary | ICD-10-CM | POA: Diagnosis not present

## 2023-04-14 DIAGNOSIS — Z96651 Presence of right artificial knee joint: Secondary | ICD-10-CM | POA: Diagnosis not present

## 2023-04-20 DIAGNOSIS — Z96651 Presence of right artificial knee joint: Secondary | ICD-10-CM | POA: Diagnosis not present

## 2023-04-20 DIAGNOSIS — M5451 Vertebrogenic low back pain: Secondary | ICD-10-CM | POA: Diagnosis not present

## 2023-04-20 DIAGNOSIS — R531 Weakness: Secondary | ICD-10-CM | POA: Diagnosis not present

## 2023-04-20 DIAGNOSIS — M25561 Pain in right knee: Secondary | ICD-10-CM | POA: Diagnosis not present

## 2023-04-27 DIAGNOSIS — M25561 Pain in right knee: Secondary | ICD-10-CM | POA: Diagnosis not present

## 2023-04-27 DIAGNOSIS — M5451 Vertebrogenic low back pain: Secondary | ICD-10-CM | POA: Diagnosis not present

## 2023-04-27 DIAGNOSIS — R531 Weakness: Secondary | ICD-10-CM | POA: Diagnosis not present

## 2023-04-27 DIAGNOSIS — Z96651 Presence of right artificial knee joint: Secondary | ICD-10-CM | POA: Diagnosis not present

## 2023-05-04 DIAGNOSIS — M25561 Pain in right knee: Secondary | ICD-10-CM | POA: Diagnosis not present

## 2023-05-04 DIAGNOSIS — Z96651 Presence of right artificial knee joint: Secondary | ICD-10-CM | POA: Diagnosis not present

## 2023-05-04 DIAGNOSIS — M5451 Vertebrogenic low back pain: Secondary | ICD-10-CM | POA: Diagnosis not present

## 2023-05-04 DIAGNOSIS — R531 Weakness: Secondary | ICD-10-CM | POA: Diagnosis not present

## 2023-05-11 DIAGNOSIS — R531 Weakness: Secondary | ICD-10-CM | POA: Diagnosis not present

## 2023-05-11 DIAGNOSIS — M25561 Pain in right knee: Secondary | ICD-10-CM | POA: Diagnosis not present

## 2023-05-11 DIAGNOSIS — M5451 Vertebrogenic low back pain: Secondary | ICD-10-CM | POA: Diagnosis not present

## 2023-05-11 DIAGNOSIS — Z96651 Presence of right artificial knee joint: Secondary | ICD-10-CM | POA: Diagnosis not present

## 2023-05-18 DIAGNOSIS — M5451 Vertebrogenic low back pain: Secondary | ICD-10-CM | POA: Diagnosis not present

## 2023-05-18 DIAGNOSIS — R531 Weakness: Secondary | ICD-10-CM | POA: Diagnosis not present

## 2023-05-18 DIAGNOSIS — Z96651 Presence of right artificial knee joint: Secondary | ICD-10-CM | POA: Diagnosis not present

## 2023-05-18 DIAGNOSIS — M25561 Pain in right knee: Secondary | ICD-10-CM | POA: Diagnosis not present

## 2023-05-26 DIAGNOSIS — M25561 Pain in right knee: Secondary | ICD-10-CM | POA: Diagnosis not present

## 2023-06-29 ENCOUNTER — Emergency Department (HOSPITAL_COMMUNITY)
Admission: EM | Admit: 2023-06-29 | Discharge: 2023-06-29 | Disposition: A | Discharge status: Home / Self Care | Payer: Medicare Other | Source: Home / Self Care | Attending: Emergency Medicine | Admitting: Emergency Medicine

## 2023-06-29 ENCOUNTER — Other Ambulatory Visit: Payer: Self-pay

## 2023-06-29 ENCOUNTER — Emergency Department (HOSPITAL_COMMUNITY): Payer: Medicare Other

## 2023-06-29 DIAGNOSIS — S4981XA Other specified injuries of right shoulder and upper arm, initial encounter: Secondary | ICD-10-CM | POA: Diagnosis not present

## 2023-06-29 DIAGNOSIS — Z7982 Long term (current) use of aspirin: Secondary | ICD-10-CM | POA: Insufficient documentation

## 2023-06-29 DIAGNOSIS — M25511 Pain in right shoulder: Secondary | ICD-10-CM | POA: Diagnosis not present

## 2023-06-29 DIAGNOSIS — M19011 Primary osteoarthritis, right shoulder: Secondary | ICD-10-CM | POA: Diagnosis not present

## 2023-06-29 DIAGNOSIS — W19XXXA Unspecified fall, initial encounter: Secondary | ICD-10-CM

## 2023-06-29 DIAGNOSIS — R6889 Other general symptoms and signs: Secondary | ICD-10-CM | POA: Diagnosis not present

## 2023-06-29 DIAGNOSIS — R0781 Pleurodynia: Secondary | ICD-10-CM | POA: Insufficient documentation

## 2023-06-29 DIAGNOSIS — M79601 Pain in right arm: Secondary | ICD-10-CM | POA: Diagnosis present

## 2023-06-29 DIAGNOSIS — W0110XA Fall on same level from slipping, tripping and stumbling with subsequent striking against unspecified object, initial encounter: Secondary | ICD-10-CM | POA: Diagnosis not present

## 2023-06-29 MED ORDER — ONDANSETRON 4 MG PO TBDP
4.0000 mg | ORAL_TABLET | Freq: Once | ORAL | Status: AC
  Administered 2023-06-29: 4 mg via ORAL
  Filled 2023-06-29: qty 1

## 2023-06-29 MED ORDER — NAPROXEN 500 MG PO TABS
500.0000 mg | ORAL_TABLET | Freq: Two times a day (BID) | ORAL | 0 refills | Status: AC
Start: 2023-06-29 — End: 2023-07-06

## 2023-06-29 MED ORDER — CYCLOBENZAPRINE HCL 10 MG PO TABS: 10.0000 mg | ORAL_TABLET | Freq: Three times a day (TID) | ORAL | 0 refills | Status: AC

## 2023-06-29 MED ORDER — HYDROCODONE-ACETAMINOPHEN 5-325 MG PO TABS
1.0000 | ORAL_TABLET | Freq: Once | ORAL | Status: AC
Start: 2023-06-29 — End: 2023-06-29
  Administered 2023-06-29: 1 via ORAL
  Filled 2023-06-29: qty 1

## 2023-06-29 NOTE — ED Triage Notes (Signed)
Pt BIB EMS pt tripped and fell on right side c/o right shoulder and right rib pain.  20g right forearm Fentanyl  4mg  Zofran

## 2023-06-29 NOTE — ED Provider Notes (Signed)
[facility] EMERGENCY DEPARTMENT AT [facility] Provider Note   CSN: [id number] Arrival date & time: 06/29/23  1300     History HSV, Seizures Chief Complaint  Patient presents with   [name] is a 52 y.o. female.  52 year old female with a past medical history of fever, headache, HSV presents to the ED via EMS status post mechanical fall.  Patient reports she was at her husband's office when suddenly she tripped and fell striking the right shoulder, right side of her ribs.  She reports pain to the area exacerbated with any type of movement along with lifting of her right arm.  She received 50 mg of fentanyl which did help with some of the pain but then she became nauseated.  Pain also exacerbated with any type of inspiration and expiration.  She did not strike her head, did not have any loss of consciousness.  Denies any headache, shortness of breath, chest pain.  The history is provided by the patient.  Fall Pertinent negatives include no chest pain and no shortness of breath.       Home Medications Prior to Admission medications   Medication Sig Start Date End Date Taking? Authorizing Provider  cyclobenzaprine (FLEXERIL) 10 MG tablet Take 1 tablet (10 mg total) by mouth 3 (three) times daily for 7 days. 06/29/23 07/06/23 Yes Debi Cousin, PA-C  naproxen (NAPROSYN) 500 MG tablet Take 1 tablet (500 mg total) by mouth 2 (two) times daily for 7 days. 06/29/23 07/06/23 Yes Lerline Valdivia, PA-C  acetaZOLAMIDE (DIAMOX) 250 MG tablet Take 125 mg by mouth daily.    [provider]  acyclovir ointment (ZOVIRAX) 5 % Apply 1 application topically 5 (five) times daily as needed. 07/05/19   Fontaine, Nadyne Coombes, MD  ALPRAZolam Prudy Feeler) 0.5 MG tablet Take 0.5 mg by mouth daily as needed for anxiety.     [provider]  aspirin 81 MG chewable tablet Chew 1 tablet (81 mg total) by mouth 2 (two) times daily. 02/12/21   Elodia Florence, PA-C  cholecalciferol  (VITAMIN D3) 25 MCG (1000 UT) tablet Take 1,000 Units by mouth daily.    [provider]  HYDROcodone-acetaminophen (NORCO/VICODIN) 5-325 MG tablet Take 1-2 tablets by mouth every 6 (six) hours as needed for moderate pain or severe pain (for post op pain). 02/12/21   Elodia Florence, PA-C  LamoTRIgine 300 MG TB24 24 hour tablet Take 300 mg by mouth 2 (two) times daily.    [provider]  levETIRAcetam (KEPPRA XR) 500 MG 24 hr tablet Take 1,000 mg by mouth 2 (two) times daily.     [provider]  levothyroxine (SYNTHROID, LEVOTHROID) 125 MCG tablet Take 125 mcg by mouth daily.    [provider]  LORazepam (ATIVAN) 0.5 MG tablet Take 0.5 mg by mouth daily as needed for seizure. 01/14/21   [provider]  Multiple Vitamin (MULTIVITAMIN WITH MINERALS) TABS Take 1 tablet by mouth daily.    [provider]  oxcarbazepine (TRILEPTAL) 600 MG tablet Take 300 mg by mouth 2 (two) times daily.     [provider]  vitamin B-12 (CYANOCOBALAMIN) 100 MCG tablet Take 100 mcg by mouth daily.    [provider]      Allergies    Other and Oxycodone    Review of Systems   Review of Systems  Constitutional:  Negative for fever.  Respiratory:  Negative for shortness of breath.   Cardiovascular:  Negative  for chest pain.  Musculoskeletal:  Positive for arthralgias and myalgias.  Skin:  Negative for pallor and wound.    Physical Exam Updated Vital Signs BP 126/84 (BP Location: Left Arm)   Pulse 81   Temp 98.7 F (37.1 C) (Oral)   Resp 16   Ht 5\' 7"  (1.702 m)   Wt 72.6 kg   LMP 02/05/2013   SpO2 100%   BMI 25.07 kg/m  Physical Exam Vitals and nursing note reviewed.  Constitutional:      Appearance: Normal appearance.  HENT:     Head: Normocephalic and atraumatic.     Mouth/Throat:     Mouth: Mucous membranes are moist.  Eyes:     Pupils: Pupils are equal, round, and reactive to light.  Cardiovascular:     Rate and Rhythm:  Normal rate.  Pulmonary:     Effort: Pulmonary effort is normal.     Comments: Absent lungs sounds.  Abdominal:     General: Abdomen is flat.     Tenderness: There is no abdominal tenderness.  Musculoskeletal:     Right shoulder: Tenderness present. Decreased range of motion. Normal pulse.     Cervical back: Normal range of motion and neck supple.     Comments: 2+ radial pulse to the right arm, worsening pain with overhead extension, limited range of motion due to pain.  Skin:    General: Skin is warm and dry.  Neurological:     Mental Status: She is alert and oriented to person, place, and time.     ED Results / Procedures / Treatments   Labs (all labs ordered are listed, but only abnormal results are displayed) Labs Reviewed - No data to display  EKG None  Radiology DG Ribs Unilateral W/Chest Right  Result Date: 06/29/2023 CLINICAL DATA:  Pain after fall EXAM: RIGHT RIBS AND CHEST - 2 VIEW COMPARISON:  10/08/2021 FINDINGS: Slight linear opacity left lung base likely scar or atelectasis. No consolidation, pneumothorax or effusion. No edema. Normal cardiopericardial silhouette. IMPRESSION: No rib fracture seen.  No pneumothorax Electronically Signed   By: Karen Kays M.D.   On: 06/29/2023 14:56   DG Shoulder Right  Result Date: 06/29/2023 CLINICAL DATA:  Pain after fall EXAM: RIGHT SHOULDER - 2 VIEW COMPARISON:  None Available. FINDINGS: Mild degenerative changes of the [facility] joint and glenohumeral joint with small osteophytes. No fracture or dislocation. Preserved bone mineralization. IMPRESSION: Mild degenerative changes. Electronically Signed   By: Karen Kays M.D.   On: 06/29/2023 14:55    Procedures Procedures    Medications Ordered in ED Medications  HYDROcodone-acetaminophen (NORCO/VICODIN) 5-325 MG per tablet 1 tablet (has no administration in time range)  ondansetron (ZOFRAN-ODT) disintegrating tablet 4 mg (4 mg Oral Given 06/29/23 1458)    ED Course/ Medical  Decision Making/ A&P                                 Medical Decision Making Amount and/or Complexity of Data Reviewed Radiology: ordered.  Risk Prescription drug management.   Patient here s/p mechanical fall with a chief complaint of right shoulder pain, right rib pain which started after the fall.  Given 50 of fentanyl to help with pain control, then felt nauseated therefore given some Zofran here.  Does have limited range of motion to the right shoulder due to pain.  X-ray did not show any acute fracture or dislocation.  She is  neurovascularly intact.  Also pain along the right lower side of her ribs exacerbated with any type of movement.  Given hydrocodone while in the emergency department along with muscle relaxers.  X-ray of the right ribs did not show any acute findings, did discuss with her repeat x-ray imaging in approximately 1 week to help with rule out occult fracture.  She is aware of close follow-up with primary care physician versus orthopedist.  Patient placed on a sling to help with comfort, we discussed discontinuing this in approximately 2 days.  Hemodynamically stable for discharge.    Portions of this note were generated with Scientist, clinical (histocompatibility and immunogenetics). Dictation errors may occur despite best attempts at proofreading.   Final Clinical Impression(s) / ED Diagnoses Final diagnoses:  Fall, initial encounter    Rx / DC Orders ED Discharge Orders          Ordered    naproxen (NAPROSYN) 500 MG tablet  2 times daily        06/29/23 1500    cyclobenzaprine (FLEXERIL) 10 MG tablet  3 times daily        06/29/23 1500              Claude Manges, PA-C 06/29/23 1506    Gwyneth Sprout, MD 06/29/23 1517

## 2023-06-29 NOTE — Discharge Instructions (Addendum)
I have prescribed muscle relaxers for your pain, please do not drink or drive while taking this medications as it can make you drowsy.   I have also prescribed a short course of anti-inflammatories to help with your symptoms, please take 1 tablet twice a day with food for the next 7 days.  Please follow-up with PCP in 1 week for reevaluation of your symptoms.If you experience any bowel or bladder incontinence, fever, worsening in your symptoms please return to the ED.

## 2023-07-01 NOTE — ED Notes (Signed)
07/01/23 0730 opened chart to see what ortho product was used to complete the paperwork for charges.

## 2023-07-05 ENCOUNTER — Other Ambulatory Visit: Payer: Self-pay | Admitting: Internal Medicine

## 2023-07-05 ENCOUNTER — Ambulatory Visit
Admission: RE | Admit: 2023-07-05 | Discharge: 2023-07-05 | Disposition: A | Discharge status: Home / Self Care | Payer: Medicare Other | Source: Ambulatory Visit | Attending: Internal Medicine | Admitting: Internal Medicine

## 2023-07-05 DIAGNOSIS — R0781 Pleurodynia: Secondary | ICD-10-CM

## 2023-10-04 ENCOUNTER — Encounter: Payer: Self-pay | Admitting: Nurse Practitioner

## 2023-10-04 ENCOUNTER — Ambulatory Visit (INDEPENDENT_AMBULATORY_CARE_PROVIDER_SITE_OTHER): Payer: Medicare Other | Admitting: Nurse Practitioner

## 2023-10-04 VITALS — BP 120/76 | HR 86

## 2023-10-04 DIAGNOSIS — Z8742 Personal history of other diseases of the female genital tract: Secondary | ICD-10-CM | POA: Diagnosis not present

## 2023-10-04 DIAGNOSIS — R1032 Left lower quadrant pain: Secondary | ICD-10-CM

## 2023-10-04 NOTE — Progress Notes (Signed)
   Acute Office Visit  Subjective:    Patient ID: [name], female    DOB: November 03, 1971, 52 y.o.   MRN: [id number]   HPI 52 y.o. presents today for left lower abdominal pain x 6+ months. She thinks it may even be years. 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 Describes pain as sharp, constant but severity is intermittent, nothing makes it worse, using heat and Ibuprofen with little relief. Denies urinary, vaginal, or GI symptoms. S/P [facility] for endometriosis. Has had 3 surgeries for endo lesions prior to hysterectomy. Surgeon a few years ago told her pain likely from adhesions. 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 Does feel she has had hip pain since then. No known injury.   Patient's last menstrual period was 02/05/2013.    Review of Systems  Constitutional: Negative.   Gastrointestinal:  Positive for abdominal pain. Negative for constipation, diarrhea, nausea and vomiting.  Genitourinary: Negative.   Musculoskeletal:  Positive for arthralgias and gait problem.       Objective:    Physical Exam Constitutional:      Appearance: Normal appearance.  Abdominal:     Palpations: Abdomen is soft.     Tenderness: There is no abdominal tenderness. There is no guarding or rebound.   GU: Not indicated  BP 120/76   Pulse 86   LMP 02/05/2013   SpO2 98%  Wt Readings from Last 3 Encounters:  06/29/23 160 lb 0.9 oz (72.6 kg)  10/08/21 160 lb (72.6 kg)  02/11/21 181 lb (82.1 kg)        Assessment & Plan:   Problem List Items Addressed This Visit   None Visit Diagnoses     Left lower quadrant abdominal pain    -  Primary   History of endometriosis          Plan: Will follow up with PCP. Does not appear to be GYN related but we did discuss possibility of endometriosis or adhesions. Likely musculoskeletal or GI related.   Return if symptoms worsen or fail to improve.    Olivia Mackie DNP, 9:53 AM 10/04/2023

## 2024-01-11 DIAGNOSIS — Z0000 Encounter for general adult medical examination without abnormal findings: Secondary | ICD-10-CM | POA: Diagnosis not present

## 2024-01-11 DIAGNOSIS — E7800 Pure hypercholesterolemia, unspecified: Secondary | ICD-10-CM | POA: Diagnosis not present

## 2024-01-11 DIAGNOSIS — F5101 Primary insomnia: Secondary | ICD-10-CM | POA: Diagnosis not present

## 2024-01-11 DIAGNOSIS — R109 Unspecified abdominal pain: Secondary | ICD-10-CM | POA: Diagnosis not present

## 2024-01-11 DIAGNOSIS — R0789 Other chest pain: Secondary | ICD-10-CM | POA: Diagnosis not present

## 2024-01-11 DIAGNOSIS — M19071 Primary osteoarthritis, right ankle and foot: Secondary | ICD-10-CM | POA: Diagnosis not present

## 2024-01-11 DIAGNOSIS — E038 Other specified hypothyroidism: Secondary | ICD-10-CM | POA: Diagnosis not present

## 2024-01-13 ENCOUNTER — Other Ambulatory Visit: Payer: Self-pay | Admitting: Physician Assistant

## 2024-01-13 DIAGNOSIS — Z1231 Encounter for screening mammogram for malignant neoplasm of breast: Secondary | ICD-10-CM

## 2024-01-13 DIAGNOSIS — R1030 Lower abdominal pain, unspecified: Secondary | ICD-10-CM

## 2024-01-13 DIAGNOSIS — R102 Pelvic and perineal pain: Secondary | ICD-10-CM

## 2024-01-13 DIAGNOSIS — G8929 Other chronic pain: Secondary | ICD-10-CM

## 2024-02-03 ENCOUNTER — Ambulatory Visit
Admission: RE | Admit: 2024-02-03 | Discharge: 2024-02-03 | Disposition: A | Discharge status: Home / Self Care | Payer: Medicare Other | Source: Ambulatory Visit | Attending: Physician Assistant | Admitting: Physician Assistant

## 2024-02-03 DIAGNOSIS — Z1231 Encounter for screening mammogram for malignant neoplasm of breast: Secondary | ICD-10-CM | POA: Diagnosis not present

## 2024-02-18 DIAGNOSIS — M19071 Primary osteoarthritis, right ankle and foot: Secondary | ICD-10-CM | POA: Diagnosis not present

## 2024-03-29 DIAGNOSIS — M19071 Primary osteoarthritis, right ankle and foot: Secondary | ICD-10-CM | POA: Diagnosis not present

## 2024-06-15 DIAGNOSIS — Z01818 Encounter for other preprocedural examination: Secondary | ICD-10-CM | POA: Diagnosis not present

## 2024-06-15 DIAGNOSIS — E038 Other specified hypothyroidism: Secondary | ICD-10-CM | POA: Diagnosis not present

## 2024-08-28 DIAGNOSIS — Z03818 Encounter for observation for suspected exposure to other biological agents ruled out: Secondary | ICD-10-CM | POA: Diagnosis not present

## 2024-08-28 DIAGNOSIS — J029 Acute pharyngitis, unspecified: Secondary | ICD-10-CM | POA: Diagnosis not present
